# Patient Record
Sex: Female | Born: 1985 | Race: Black or African American | Hispanic: No | Marital: Single | Smoking: Never smoker
Health system: Southern US, Community
[De-identification: ages and names within clinical notes are randomized; demographics above are authoritative.]

## PROBLEM LIST (undated history)

## (undated) ENCOUNTER — Inpatient Hospital Stay (HOSPITAL_COMMUNITY): Payer: Self-pay

## (undated) DIAGNOSIS — Z789 Other specified health status: Secondary | ICD-10-CM

## (undated) HISTORY — PX: NO PAST SURGERIES: SHX2092

---
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
## 2006-04-05 ENCOUNTER — Inpatient Hospital Stay (HOSPITAL_COMMUNITY): Admission: AD | Admit: 2006-04-05 | Discharge: 2006-04-08 | Payer: Self-pay | Admitting: Obstetrics

## 2007-02-25 IMAGING — US US OB COMP LESS 14 WK
1 series · 14 of 28 positions shown · non-contrast
Comparison: none

CLINICAL DATA: 19-year-old with vaginal bleeding.  By LMP the patient is 6 weeks 3 days.
OBSTETRICAL ULTRASOUND <14 WKS AND TRANSVAGINAL OB US:
TECHNIQUE: Both transabdominal and transvaginal ultrasound examinations were performed for complete evaluation of the gestation as well as the maternal uterus, adnexal regions, and pelvic cul-de-sac.

[Series 1: us ob comp less 14 wk · 0.31mm/px · 14 of 40 slices shown]
[im 2/40]
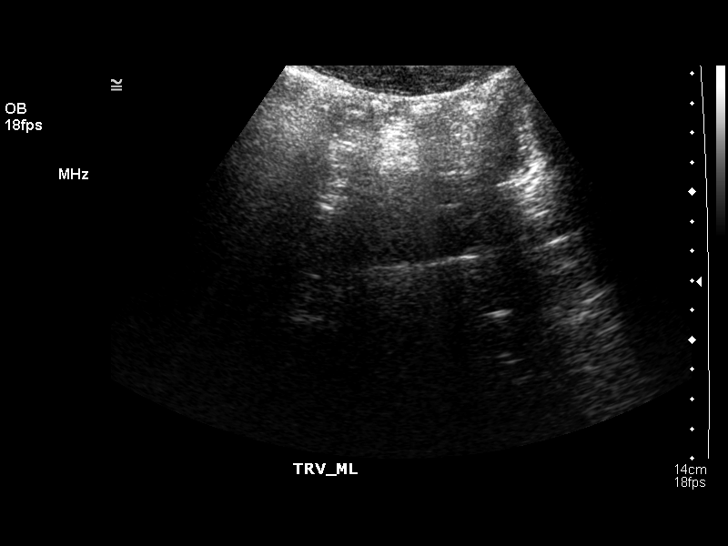
[im 5/40]
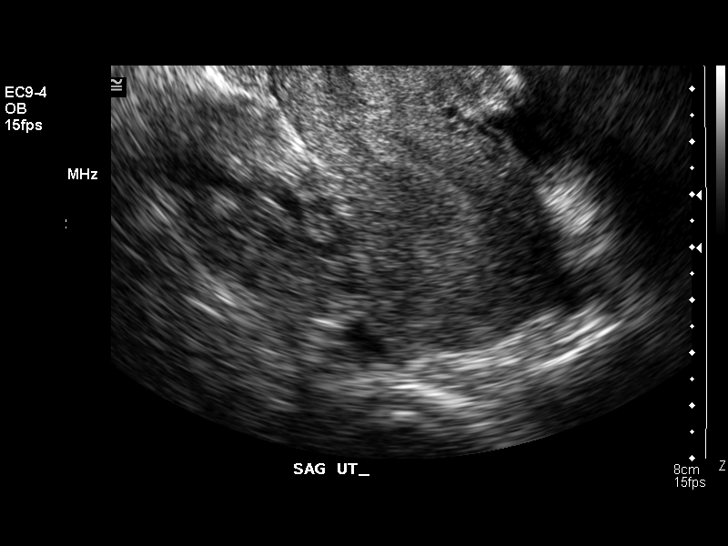
[im 8/40]
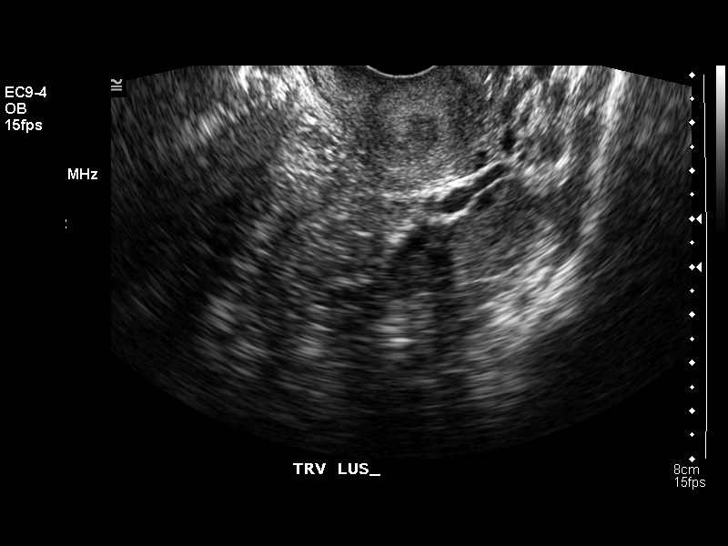
[im 11/40]
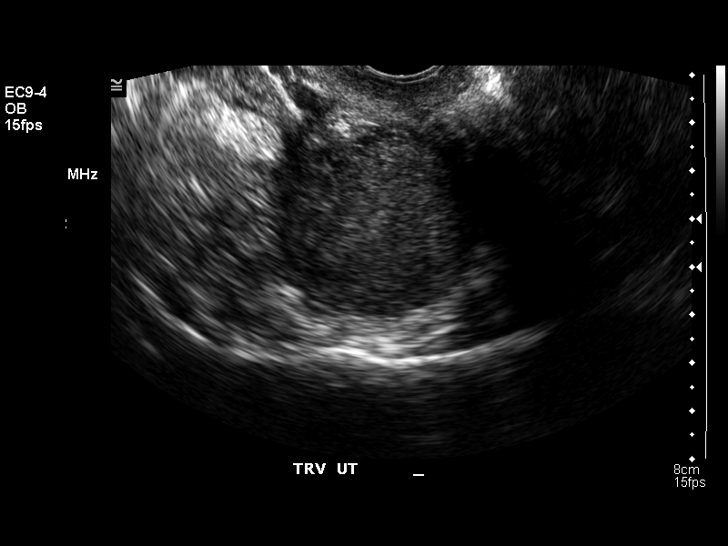
[im 14/40]
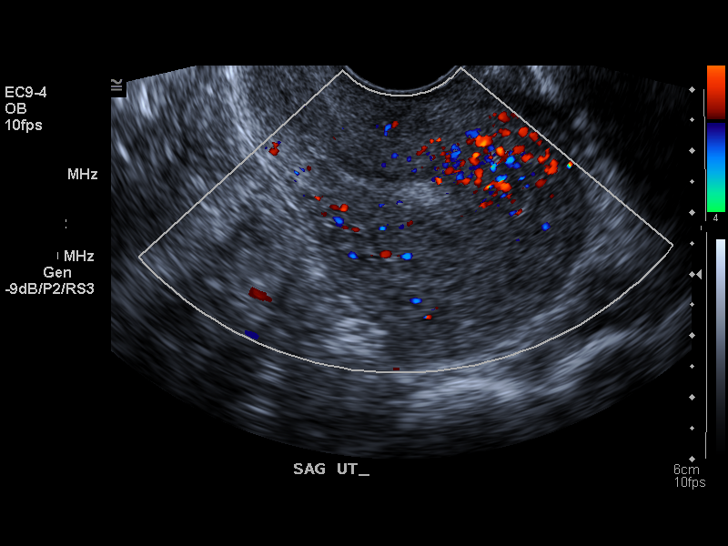
[im 16/40]
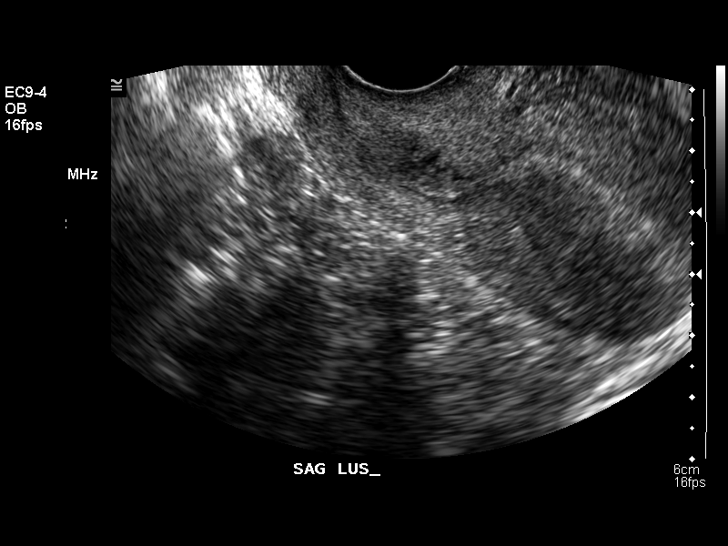
[im 19/40]
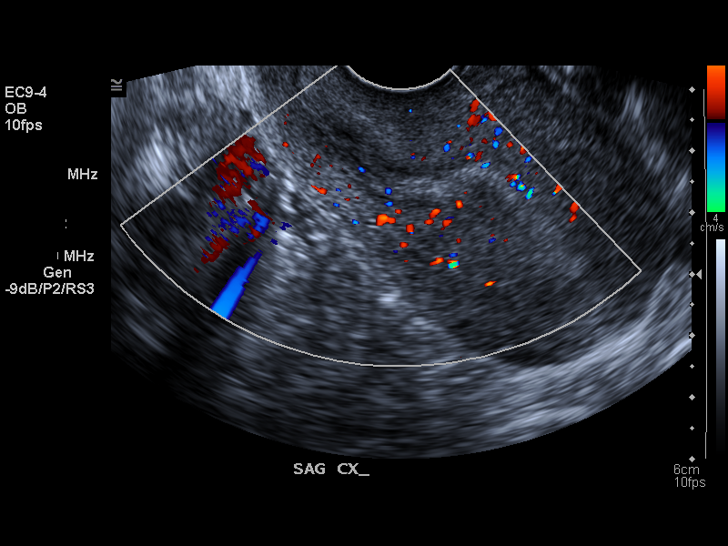
[im 22/40]
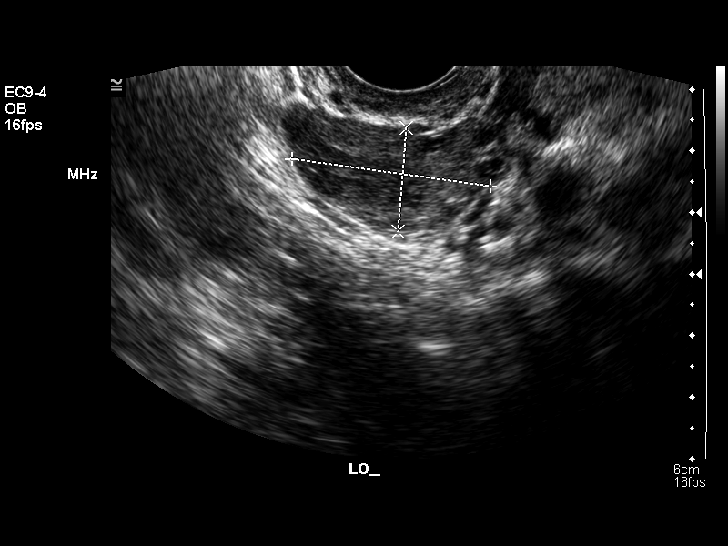
[im 25/40]
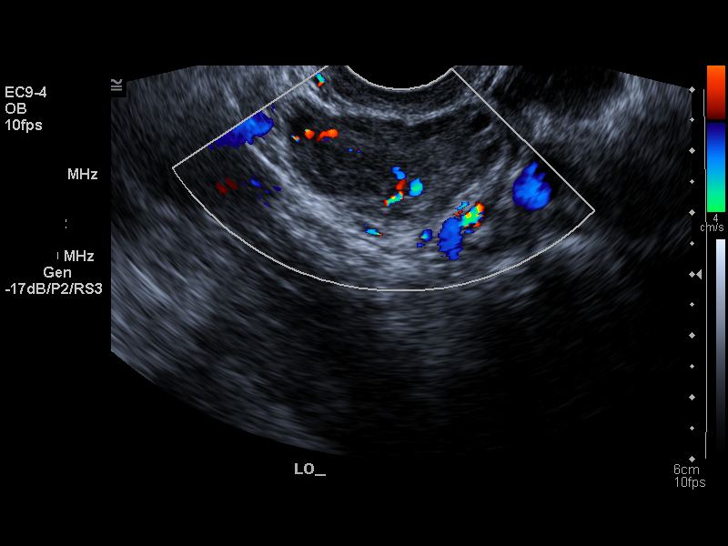
[im 28/40]
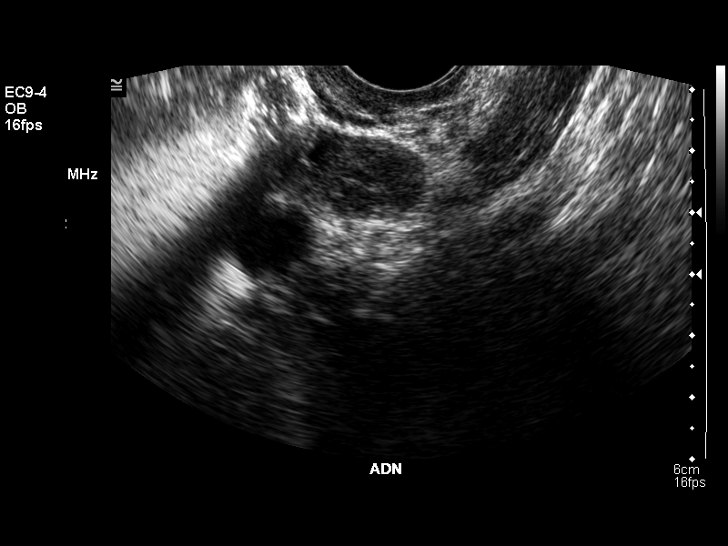
[im 31/40]
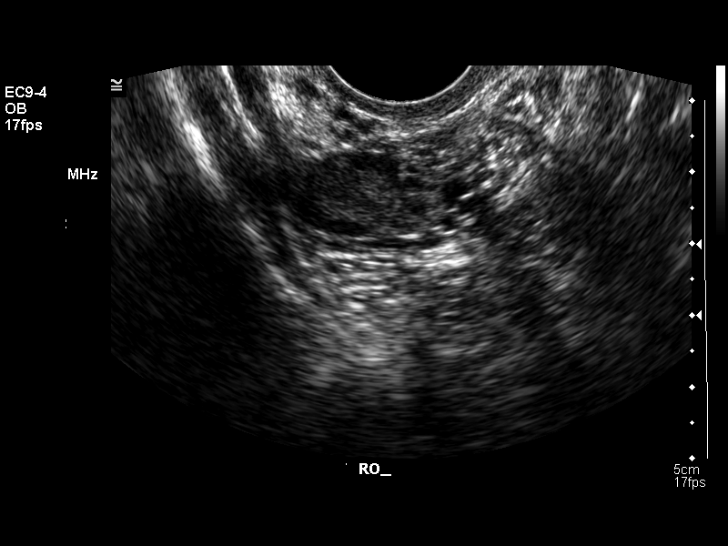
[im 34/40]
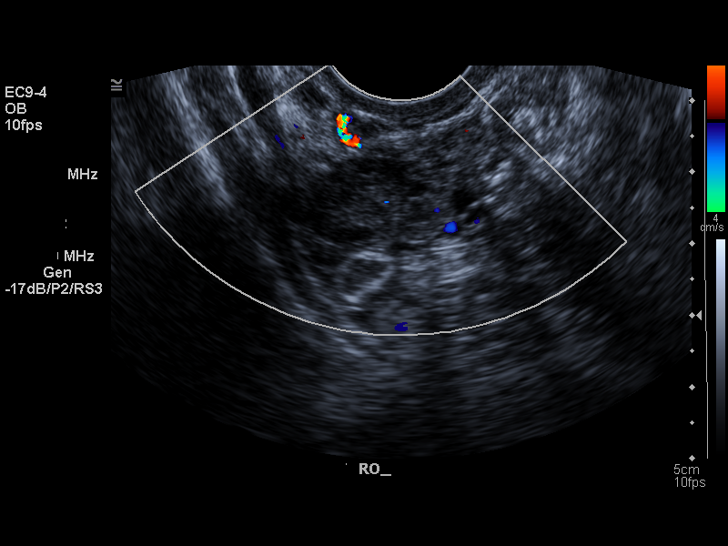
[im 37/40]
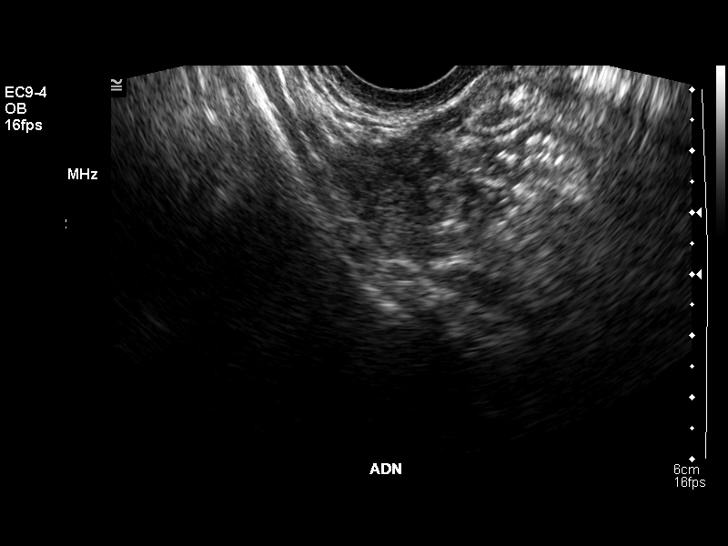
[im 40/40]
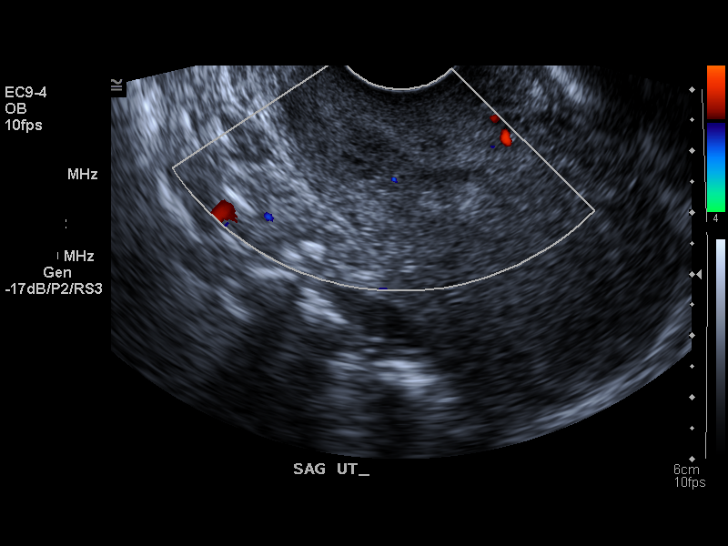

[14 of 28 positions shown; findings below may reference images not displayed]

FINDINGS: Transabdominal and endovaginal images are performed of the pelvis. 
The uterus contains no gestational sac or embryo.  The endometrial stripe is 7 mm and homogeneous.  A small amount of fluid is seen within the lower uterine canal.  The ovaries have a normal appearance.  A small amount of free pelvic fluid is likely physiologic.  No adnexal mass identified.
IMPRESSION: No evidence for intrauterine or adnexal pregnancy.  It is not known whether the findings represent a completed spontaneous abortion or an early intrauterine or ectopic pregnancy.  Close follow-up is suggested.  Serial quantitative beta hCGs and follow-up ultrasound may be helpful.

## 2007-07-18 ENCOUNTER — Inpatient Hospital Stay (HOSPITAL_COMMUNITY): Admission: AD | Admit: 2007-07-18 | Discharge: 2007-07-18 | Payer: Self-pay | Admitting: Obstetrics

## 2007-07-20 ENCOUNTER — Inpatient Hospital Stay (HOSPITAL_COMMUNITY): Admission: AD | Admit: 2007-07-20 | Discharge: 2007-07-20 | Payer: Self-pay | Admitting: Obstetrics

## 2007-08-29 ENCOUNTER — Emergency Department (HOSPITAL_COMMUNITY): Admission: EM | Admit: 2007-08-29 | Discharge: 2007-08-29 | Payer: Self-pay | Admitting: Family Medicine

## 2008-04-25 ENCOUNTER — Emergency Department (HOSPITAL_COMMUNITY): Admission: EM | Admit: 2008-04-25 | Discharge: 2008-04-25 | Payer: Self-pay | Admitting: Emergency Medicine

## 2008-10-01 ENCOUNTER — Ambulatory Visit (HOSPITAL_COMMUNITY): Admission: RE | Admit: 2008-10-01 | Discharge: 2008-10-01 | Payer: Self-pay | Admitting: Obstetrics

## 2009-01-21 ENCOUNTER — Emergency Department (HOSPITAL_COMMUNITY): Admission: EM | Admit: 2009-01-21 | Discharge: 2009-01-21 | Payer: Self-pay | Admitting: Emergency Medicine

## 2009-10-01 ENCOUNTER — Ambulatory Visit (HOSPITAL_COMMUNITY): Admission: RE | Admit: 2009-10-01 | Discharge: 2009-10-01 | Payer: Self-pay | Admitting: Obstetrics

## 2010-01-19 ENCOUNTER — Ambulatory Visit (HOSPITAL_COMMUNITY)
Admission: RE | Admit: 2010-01-19 | Discharge: 2010-01-19 | Payer: Self-pay | Source: Home / Self Care | Admitting: Obstetrics

## 2010-01-26 ENCOUNTER — Inpatient Hospital Stay (HOSPITAL_COMMUNITY)
Admission: AD | Admit: 2010-01-26 | Discharge: 2010-01-26 | Payer: Self-pay | Source: Home / Self Care | Admitting: Obstetrics

## 2010-01-26 ENCOUNTER — Ambulatory Visit: Payer: Self-pay | Admitting: Nurse Practitioner

## 2010-06-10 LAB — WET PREP, GENITAL

## 2010-07-02 LAB — POCT URINALYSIS DIP (DEVICE)
Ketones, ur: NEGATIVE mg/dL
Nitrite: NEGATIVE
Specific Gravity, Urine: 1.025 (ref 1.005–1.030)

## 2010-08-13 ENCOUNTER — Inpatient Hospital Stay (HOSPITAL_COMMUNITY)
Admission: AD | Admit: 2010-08-13 | Discharge: 2010-08-16 | DRG: 775 | Disposition: A | Discharge status: Home / Self Care | Payer: Medicaid Other | Source: Ambulatory Visit | Attending: Obstetrics | Admitting: Obstetrics

## 2010-08-13 ENCOUNTER — Encounter (HOSPITAL_COMMUNITY): Payer: Self-pay | Admitting: *Deleted

## 2010-08-13 LAB — CBC
Hemoglobin: 9.7 g/dL — ABNORMAL LOW (ref 12.0–15.0)
MCHC: 32.6 g/dL (ref 30.0–36.0)
RBC: 3.25 MIL/uL — ABNORMAL LOW (ref 3.87–5.11)

## 2010-08-14 LAB — RPR: RPR Ser Ql: NONREACTIVE

## 2010-08-15 LAB — CBC
MCH: 29.2 pg (ref 26.0–34.0)
MCHC: 31.6 g/dL (ref 30.0–36.0)
Platelets: 336 10*3/uL (ref 150–400)
RDW: 15.5 % (ref 11.5–15.5)
WBC: 8.5 10*3/uL (ref 4.0–10.5)

## 2010-12-22 LAB — ABO/RH: ABO/RH(D): A POS

## 2010-12-22 LAB — CBC
HCT: 35.3 — ABNORMAL LOW
MCHC: 34.8
RDW: 15.1
WBC: 6.8

## 2010-12-22 LAB — HCG, QUANTITATIVE, PREGNANCY: hCG, Beta Chain, Quant, S: 349 — ABNORMAL HIGH

## 2010-12-22 LAB — WET PREP, GENITAL: Trich, Wet Prep: NONE SEEN

## 2010-12-24 LAB — POCT URINALYSIS DIP (DEVICE)
Ketones, ur: NEGATIVE
Protein, ur: NEGATIVE
Specific Gravity, Urine: 1.025
pH: 6.5

## 2010-12-24 LAB — WET PREP, GENITAL
Clue Cells Wet Prep HPF POC: NONE SEEN
Trich, Wet Prep: NONE SEEN

## 2010-12-24 LAB — POCT PREGNANCY, URINE: Preg Test, Ur: NEGATIVE

## 2011-08-08 IMAGING — US US OB COMP LESS 14 WK
1 series · 14 of 28 positions shown · non-contrast
Comparison: none

OBSTETRICAL ULTRASOUND:
 This ultrasound exam was performed in the [HOSPITAL] Ultrasound Department.  The OB US report was generated in the AS system, and faxed to the ordering physician.  This report is also available in [HOSPITAL]?s Access[employer]ware and in [REDACTED] PACS.

[Series 1: us ob comp less 14 wks · 0.19mm/px · 14 of 33 slices shown]
[im 2/33]
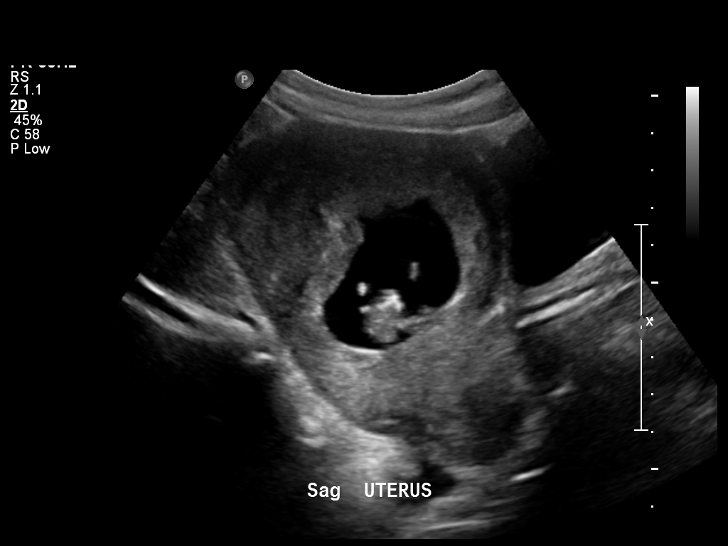
[im 4/33]
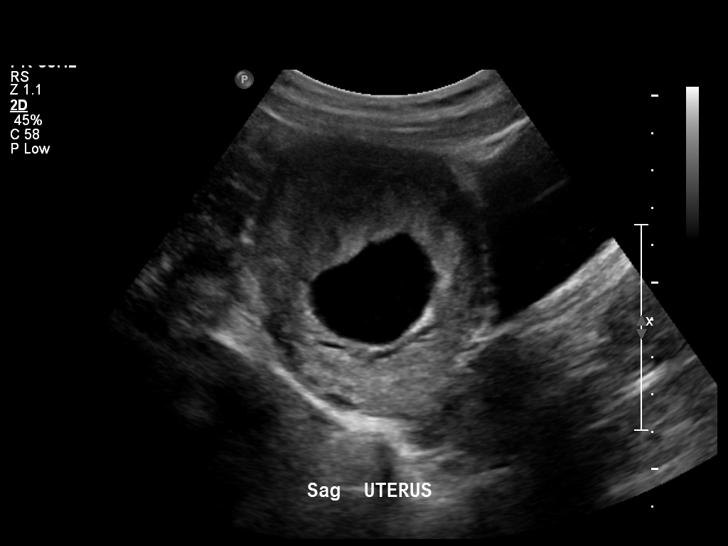
[im 6/33]
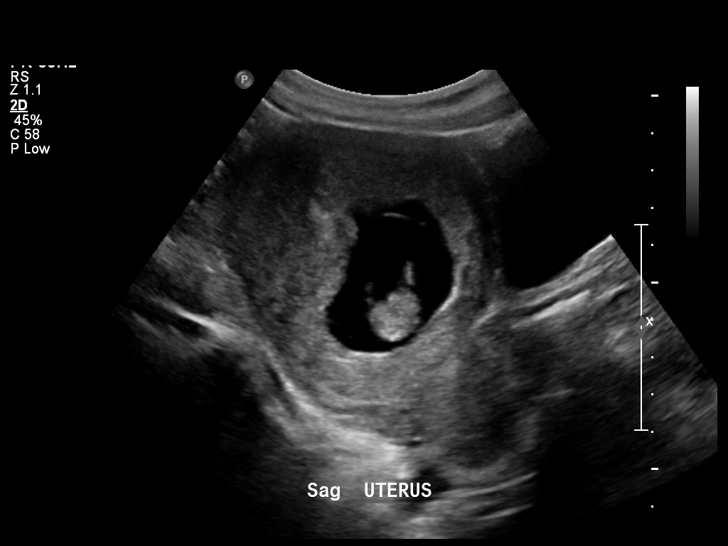
[im 9/33]
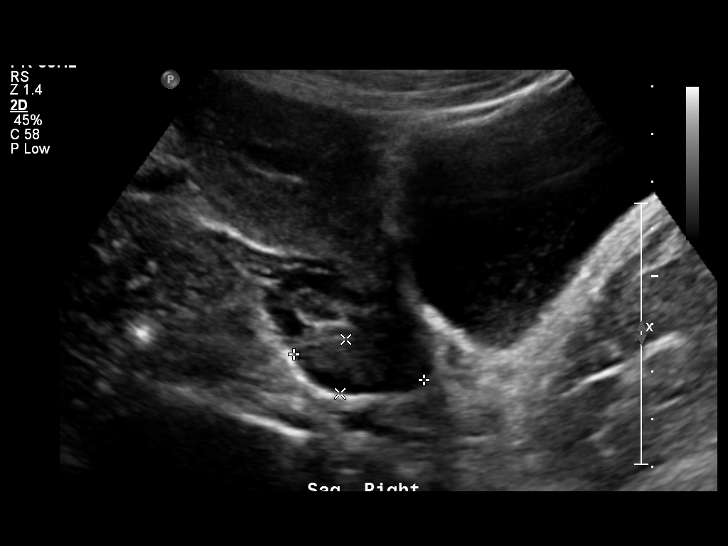
[im 11/33]
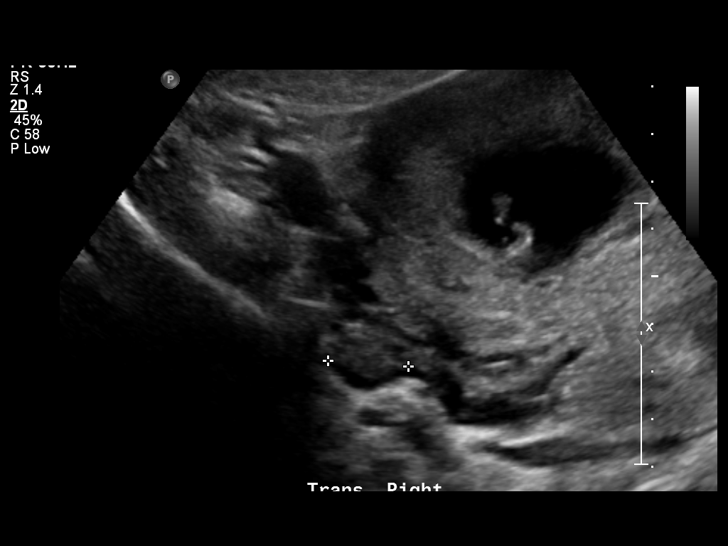
[im 14/33]
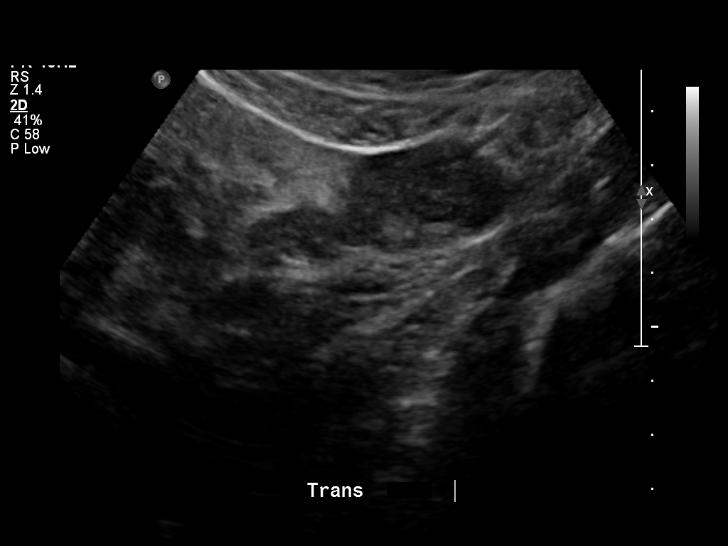
[im 16/33]
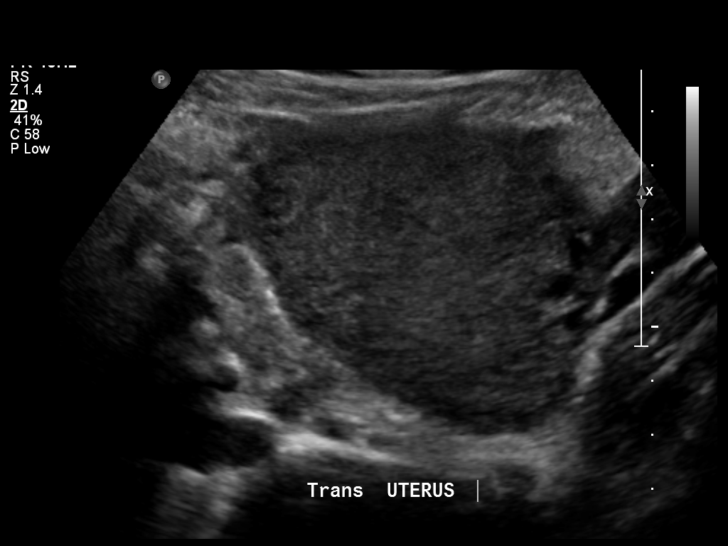
[im 18/33]
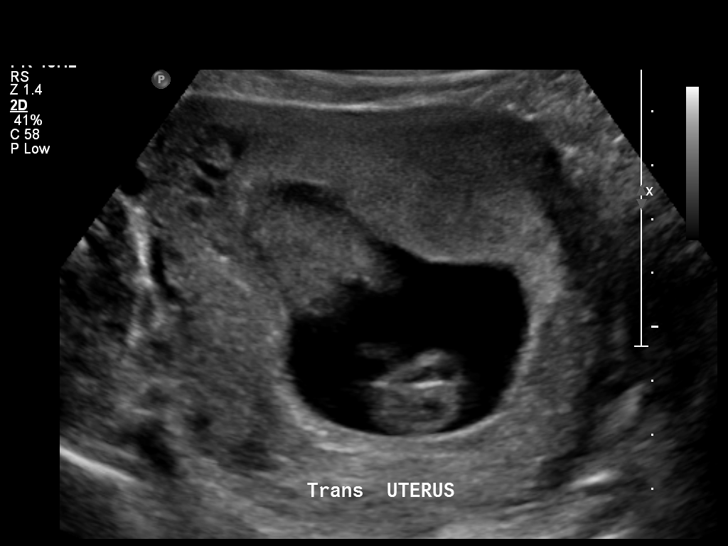
[im 21/33]
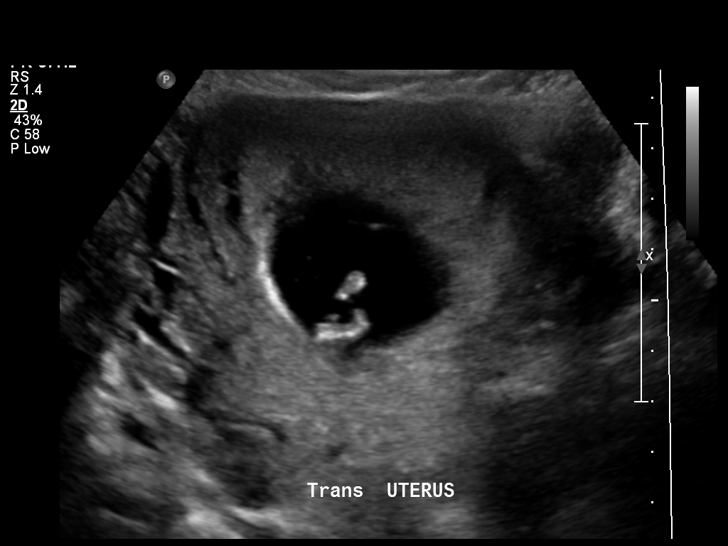
[im 23/33]
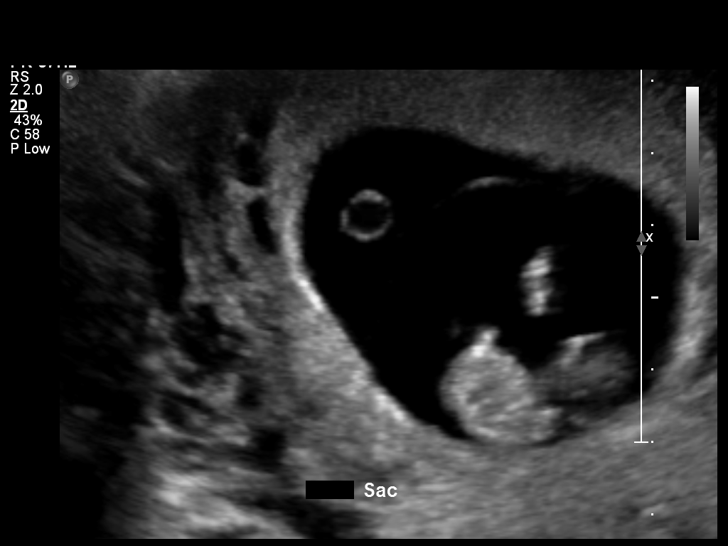
[im 25/33]
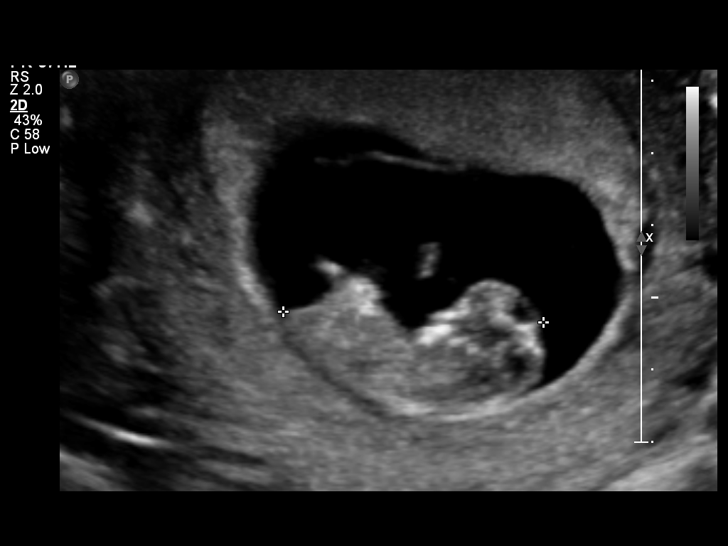
[im 28/33]
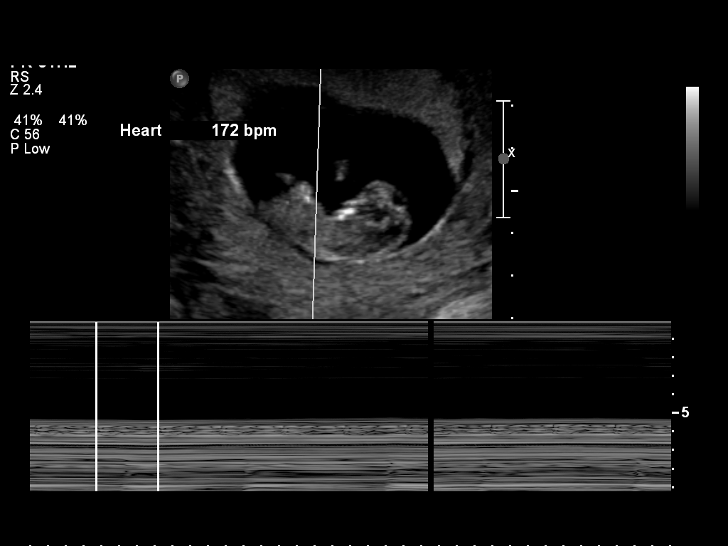
[im 30/33]
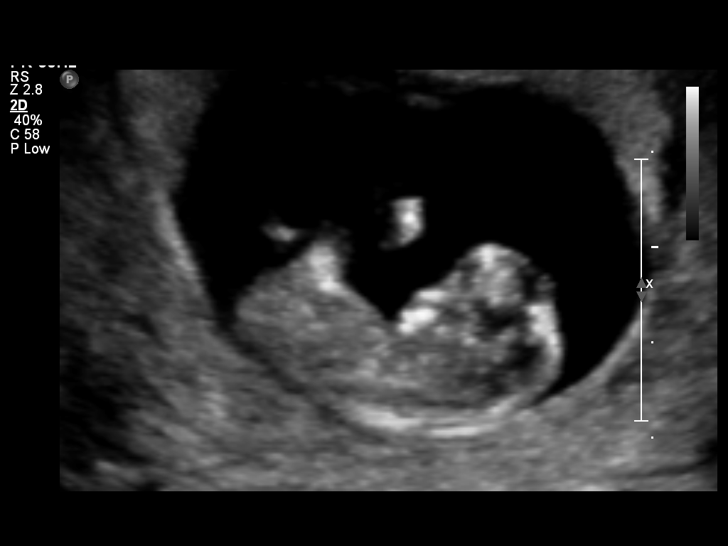
[im 33/33]
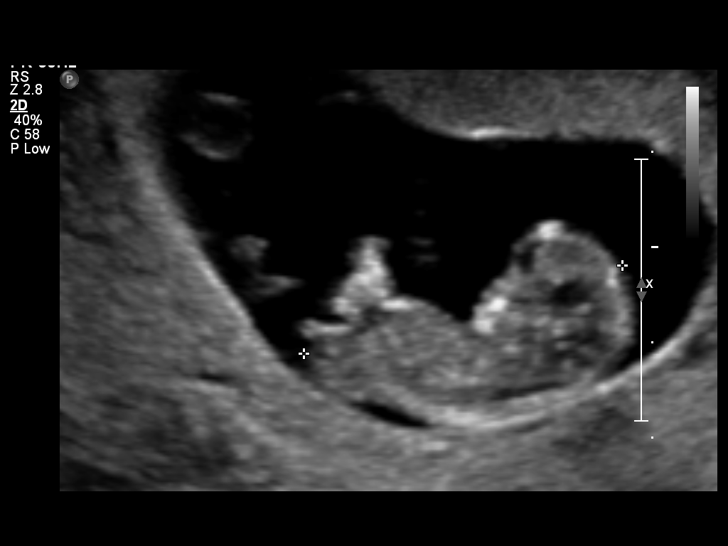

[14 of 28 positions shown; findings below may reference images not displayed]

IMPRESSION: See AS Obstetric US report.

## 2011-10-19 ENCOUNTER — Other Ambulatory Visit: Payer: Self-pay | Admitting: Obstetrics

## 2011-11-26 IMAGING — US US OB COMP LESS 14 WK
1 series · 14 of 28 positions shown · non-contrast
Comparison: none

OBSTETRICAL ULTRASOUND:
 This ultrasound exam was performed in the [HOSPITAL] Ultrasound Department.  The OB US report was generated in the AS system, and faxed to the ordering physician.  This report is also available in [HOSPITAL]?s [employer] and in [REDACTED] PACS.

[Series 1: us ob comp less 14 wks · 29 acquisitions, 14 frames shown]
[im 2/29]
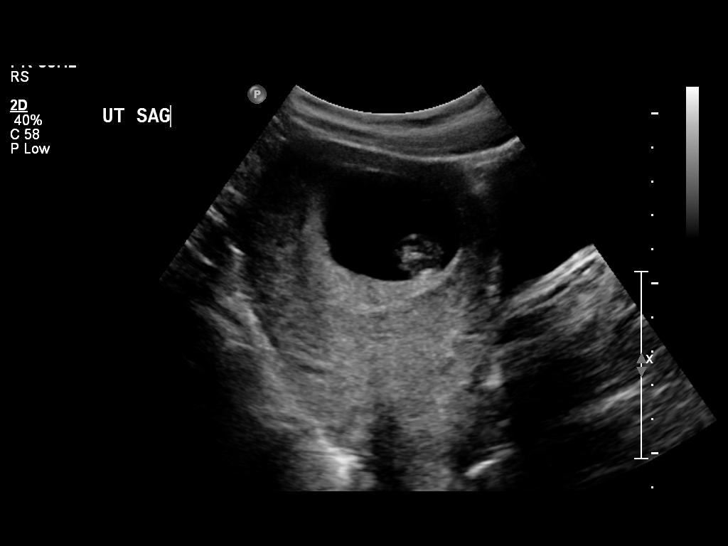
[im 4/29]
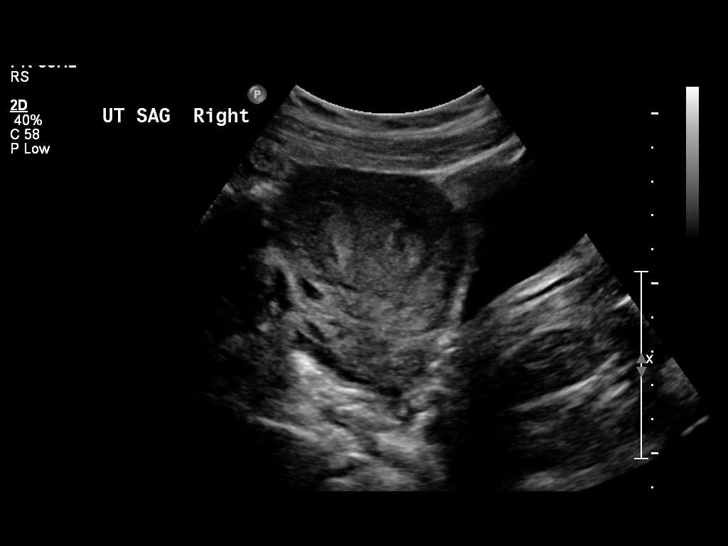
[im 6/29]
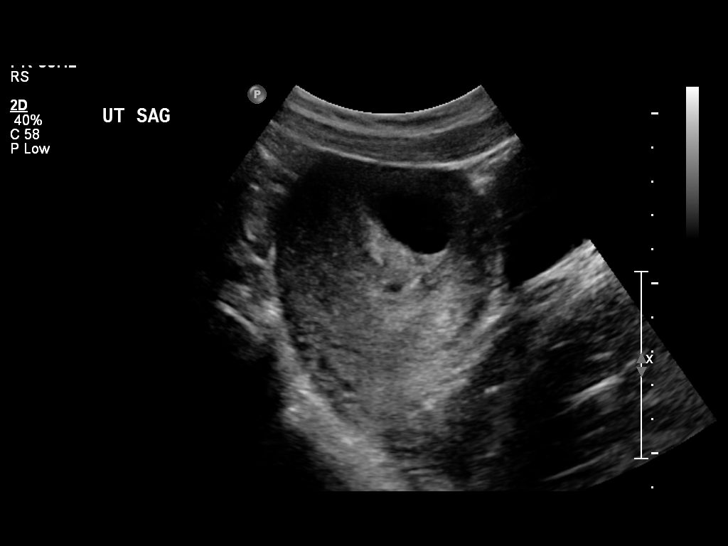
[im 8/29]
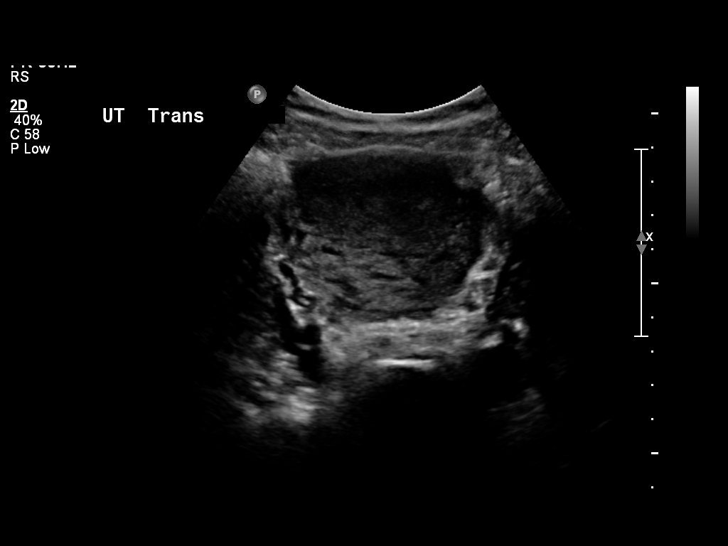
[im 10/29]
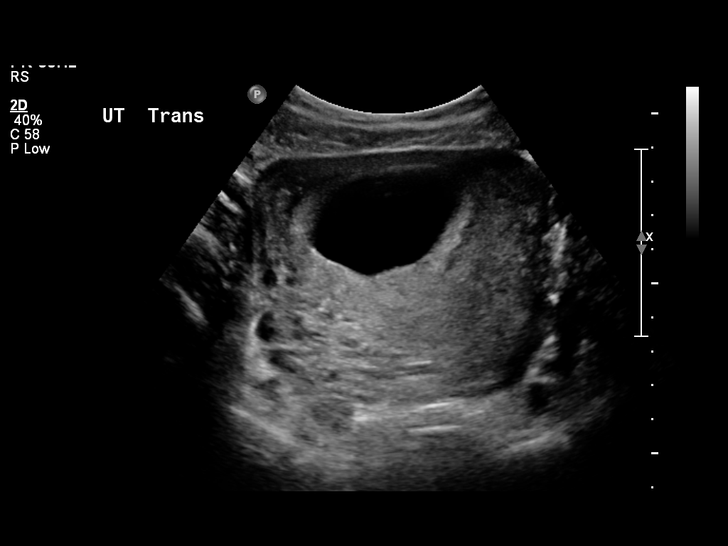
[im 12/29]
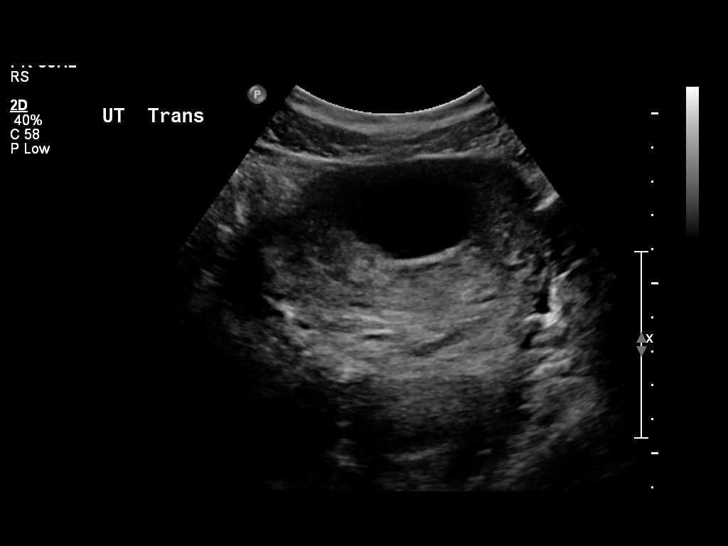
[im 14/29]
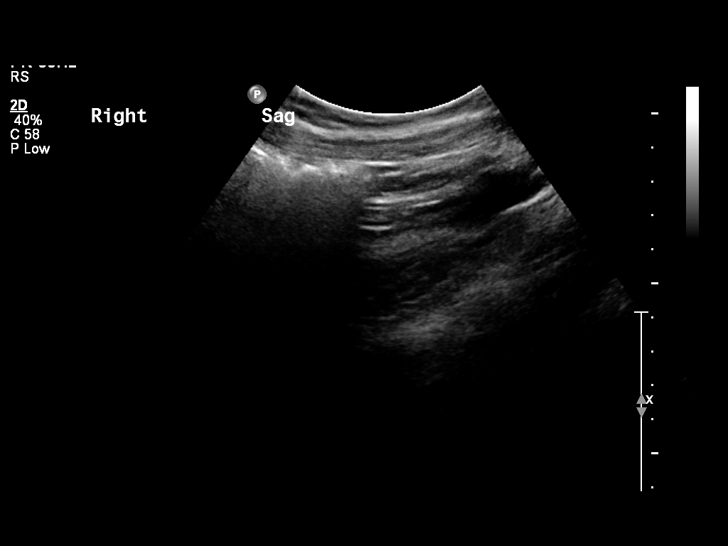
[im 16/29]
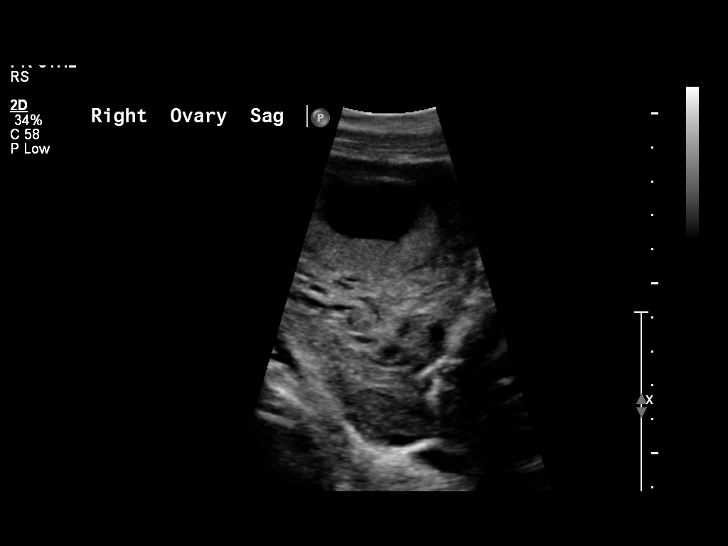
[im 18/29]
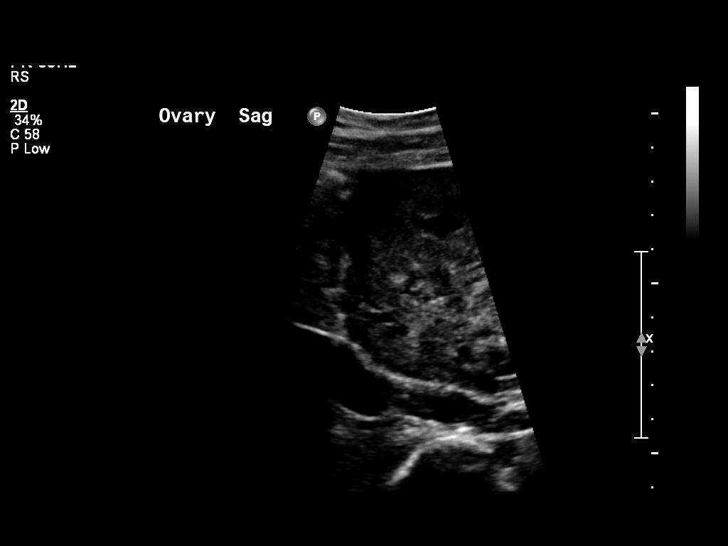
[im 20/29]
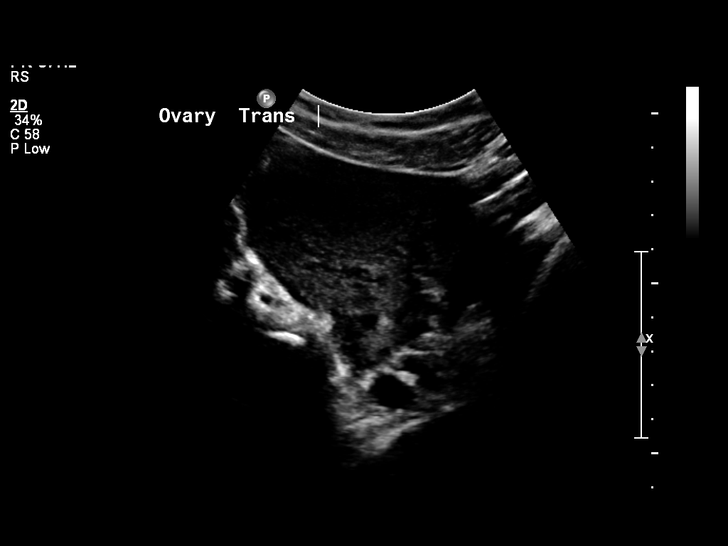
[im 22/29]
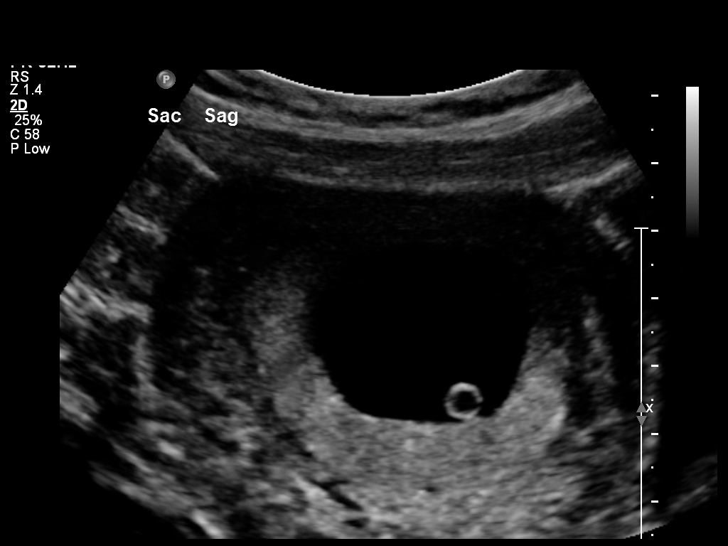
[im 24/29]
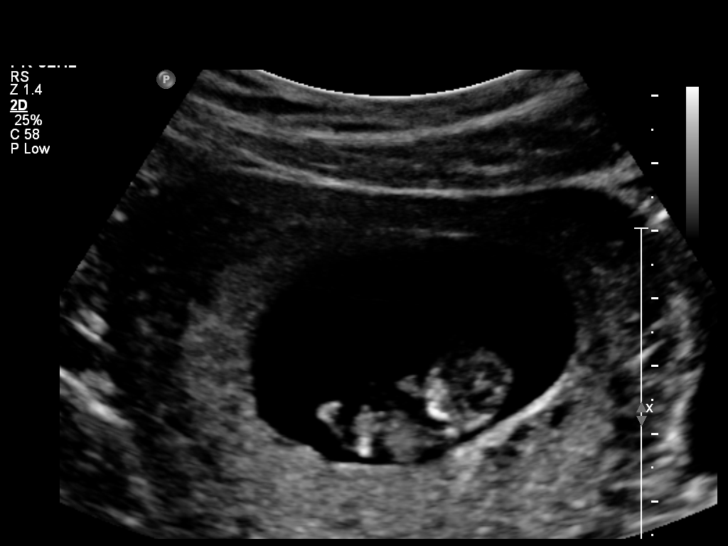
[im 26/29]
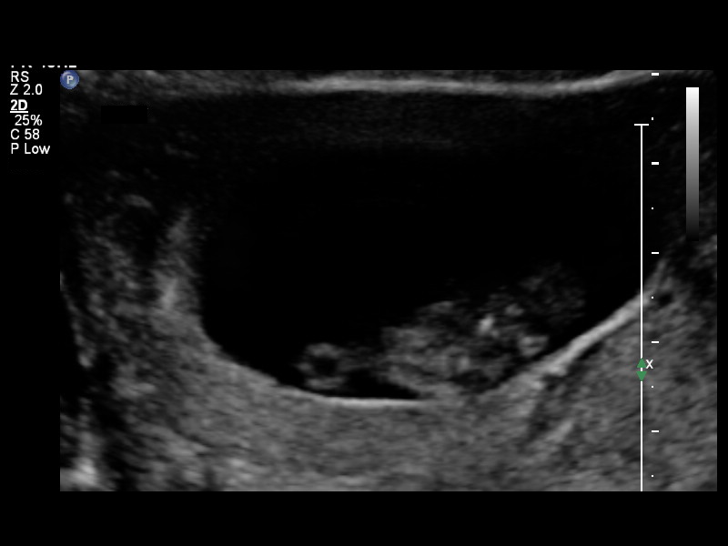
[im 29/29]
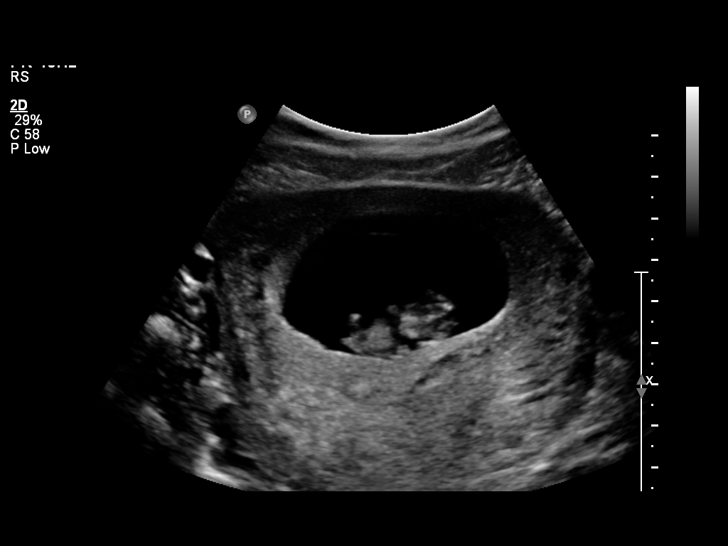

[14 of 28 positions shown; findings below may reference images not displayed]

IMPRESSION: See AS Obstetric US report.

## 2012-01-20 ENCOUNTER — Other Ambulatory Visit (HOSPITAL_COMMUNITY): Payer: Self-pay | Admitting: Obstetrics

## 2012-01-20 DIAGNOSIS — O3680X0 Pregnancy with inconclusive fetal viability, not applicable or unspecified: Secondary | ICD-10-CM

## 2012-01-20 DIAGNOSIS — Z3490 Encounter for supervision of normal pregnancy, unspecified, unspecified trimester: Secondary | ICD-10-CM

## 2012-01-24 ENCOUNTER — Ambulatory Visit (HOSPITAL_COMMUNITY)
Admission: RE | Admit: 2012-01-24 | Discharge: 2012-01-24 | Disposition: A | Discharge status: Home / Self Care | Payer: Medicaid Other | Source: Ambulatory Visit | Attending: Obstetrics | Admitting: Obstetrics

## 2012-01-24 DIAGNOSIS — O3680X0 Pregnancy with inconclusive fetal viability, not applicable or unspecified: Secondary | ICD-10-CM

## 2012-01-24 DIAGNOSIS — Z3490 Encounter for supervision of normal pregnancy, unspecified, unspecified trimester: Secondary | ICD-10-CM

## 2012-01-24 DIAGNOSIS — Z3689 Encounter for other specified antenatal screening: Secondary | ICD-10-CM | POA: Insufficient documentation

## 2012-12-14 ENCOUNTER — Inpatient Hospital Stay (HOSPITAL_COMMUNITY): Payer: Medicaid Other

## 2012-12-14 ENCOUNTER — Inpatient Hospital Stay (HOSPITAL_COMMUNITY)
Admission: AD | Admit: 2012-12-14 | Discharge: 2012-12-14 | Disposition: A | Discharge status: Home / Self Care | Payer: Medicaid Other | Source: Ambulatory Visit | Attending: Obstetrics | Admitting: Obstetrics

## 2012-12-14 ENCOUNTER — Encounter (HOSPITAL_COMMUNITY): Payer: Self-pay | Admitting: *Deleted

## 2012-12-14 DIAGNOSIS — O034 Incomplete spontaneous abortion without complication: Secondary | ICD-10-CM

## 2012-12-14 DIAGNOSIS — O039 Complete or unspecified spontaneous abortion without complication: Secondary | ICD-10-CM

## 2012-12-14 DIAGNOSIS — O071 Delayed or excessive hemorrhage following failed attempted termination of pregnancy: Secondary | ICD-10-CM | POA: Insufficient documentation

## 2012-12-14 LAB — CBC
Platelets: 363 10*3/uL (ref 150–400)
RBC: 3.4 MIL/uL — ABNORMAL LOW (ref 3.87–5.11)
WBC: 7.3 10*3/uL (ref 4.0–10.5)

## 2012-12-14 LAB — HCG, QUANTITATIVE, PREGNANCY: hCG, Beta Chain, Quant, S: 850 m[IU]/mL — ABNORMAL HIGH (ref <5)

## 2012-12-14 MED ORDER — HYDROMORPHONE HCL PF 1 MG/ML IJ SOLN
1.0000 mg | Freq: Once | INTRAMUSCULAR | Status: AC
  Administered 2012-12-14: 1 mg via INTRAVENOUS
  Filled 2012-12-14: qty 1

## 2012-12-14 MED ORDER — METHYLERGONOVINE MALEATE 0.2 MG PO TABS: 0.2000 mg | ORAL_TABLET | Freq: Four times a day (QID) | ORAL | Status: DC

## 2012-12-14 MED ORDER — LACTATED RINGERS IV BOLUS (SEPSIS)
1000.0000 mL | Freq: Once | INTRAVENOUS | Status: AC
  Administered 2012-12-14: 1000 mL via INTRAVENOUS

## 2012-12-14 NOTE — MAU Note (Addendum)
Terminated a preg last Friday ([place]). Was doing ok, has been bleeding- started getting heavier 4 days later, started passing clots- bigger than a golf ball and  [place] blood this morning.Marland Kitchen

## 2012-12-14 NOTE — MAU Provider Note (Signed)
History     CSN: [id number]  Arrival date and time: 12/14/12 0906   None     No chief complaint on file.  HPI  Pt is 27 yo black G4P0020 s/p TAB @ 14 weeks on 12/08/2012.  Pt started having heavy bleeding with clots this morning about 6 am. Pt has saturated about 3 pads in 4 hours. Pt denies cramping or fever.  Pt states there were no complications with her procedure.  Pt last saw Dr. Gaynell Face in Dec 2013. Pt says she was not checked for infections. Pt last ate last night about 8 pm.  Pt states she is not having pain at this time and does not want anything for pain.   History reviewed. No pertinent past medical history.  History reviewed. No pertinent past surgical history.  History reviewed. No pertinent family history.  History  Substance Use Topics  . Smoking status: Never Smoker   . Smokeless tobacco: Never Used  . Alcohol Use: No    Allergies: No Known Allergies  No prescriptions prior to admission    Review of Systems  Gastrointestinal: Negative for nausea, vomiting and abdominal pain.  Genitourinary: Negative for dysuria and urgency.   Physical Exam   Blood pressure 122/71, pulse 95, temperature 99 F (37.2 C), temperature source Oral, resp. rate 18, height 5\' 4"  (1.626 m), weight 77.565 kg (171 lb), last menstrual period 08/24/2012, unknown if currently breastfeeding.  Physical Exam  Nursing note and vitals reviewed. Constitutional: She is oriented to person, place, and time. She appears well-developed and well-nourished. No distress.  HENT:  Head: Normocephalic.  Eyes: Pupils are equal, round, and reactive to light.  Neck: Normal range of motion. Neck supple.  Cardiovascular: Normal rate.   Respiratory: Effort normal.  GI: Soft. She exhibits no distension. There is no tenderness.  Genitourinary:  Large amount of bright red blood in os with large clots; cervix dilated with tissue at os- unable to get all of tissue- bleeding controlled after clots removed.   Musculoskeletal: Normal range of motion.  Neurological: She is alert and oriented to person, place, and time.  Skin: Skin is warm and dry.  Psychiatric: She has a normal mood and affect.    MAU Course  Procedures Results for orders placed during the hospital encounter of 12/14/12 (from the past 24 hour(s))  CBC     Status: Abnormal   Collection Time    12/14/12  9:30 AM      Result Value Range   WBC 7.3  4.0 - 10.5 K/uL   RBC 3.40 (*) 3.87 - 5.11 MIL/uL   Hemoglobin 10.4 (*) 12.0 - 15.0 g/dL   HCT 95.6 (*) 21.3 - 08.6 %   MCV 92.6  78.0 - 100.0 fL   MCH 30.6  26.0 - 34.0 pg   MCHC 33.0  30.0 - 36.0 g/dL   RDW 57.8  46.9 - 62.9 %   Platelets 363  150 - 400 K/uL  HCG, QUANTITATIVE, PREGNANCY     Status: Abnormal   Collection Time    12/14/12  9:30 AM      Result Value Range   hCG, Beta Chain, Quant, S 850 (*) <5 mIU/mL   US Transvaginal Non-ob  12/14/2012   CLINICAL DATA:  Heavy bleeding. Post abortion 12/08/2012.  EXAM: TRANSABDOMINAL AND TRANSVAGINAL ULTRASOUND OF PELVIS  TECHNIQUE: Both transabdominal and transvaginal ultrasound examinations of the pelvis were performed. Transabdominal technique was performed for global imaging of the pelvis including uterus, ovaries,  adnexal regions, and pelvic cul-de-sac. It was necessary to proceed with endovaginal exam following the transabdominal exam to visualize the uterus, endometrium and ovaries.  COMPARISON:  None  FINDINGS: Uterus  Measurements: 10.6 x 6.0 x 6.8 cm. No fibroids or other mass visualized.  Endometrium  Thickness: Thickened, 19 mm. Heterogeneous material within the endometrium with internal blood flow measures 3.5 x 2.8 x 2.1 cm compatible with retained products of conception.  Right ovary  Measurements: 3.3 x 1.6 x 1.8 cm. Normal appearance/no adnexal mass.  Left ovary  Measurements: 4.1 x 1.7 x 2.2 cm. Normal appearance/no adnexal mass.  Other findings  Trace free fluid in the pelvis.  IMPRESSION: Heterogeneous material  within the thickened endometrium which contains internal blood flow compatible with retained products of conception.   Electronically Signed   By: Charlett Nose M.D.   On: 12/14/2012 11:59   US Pelvis Complete  12/14/2012   CLINICAL DATA:  Heavy bleeding. Post abortion 12/08/2012.  EXAM: TRANSABDOMINAL AND TRANSVAGINAL ULTRASOUND OF PELVIS  TECHNIQUE: Both transabdominal and transvaginal ultrasound examinations of the pelvis were performed. Transabdominal technique was performed for global imaging of the pelvis including uterus, ovaries, adnexal regions, and pelvic cul-de-sac. It was necessary to proceed with endovaginal exam following the transabdominal exam to visualize the uterus, endometrium and ovaries.  COMPARISON:  None  FINDINGS: Uterus  Measurements: 10.6 x 6.0 x 6.8 cm. No fibroids or other mass visualized.  Endometrium  Thickness: Thickened, 19 mm. Heterogeneous material within the endometrium with internal blood flow measures 3.5 x 2.8 x 2.1 cm compatible with retained products of conception.  Right ovary  Measurements: 3.3 x 1.6 x 1.8 cm. Normal appearance/no adnexal mass.  Left ovary  Measurements: 4.1 x 1.7 x 2.2 cm. Normal appearance/no adnexal mass.  Other findings  Trace free fluid in the pelvis.  IMPRESSION: Heterogeneous material within the thickened endometrium which contains internal blood flow compatible with retained products of conception.   Electronically Signed   By: Charlett Nose M.D.   On: 12/14/2012 11:59  pt had episode of syncope  IVF LR started- rapid response team called Dr. Gaynell Face called- will come to see pt 12:15 pmDr. Gaynell Face here- curretaged uterus for remaining material - bleeding scant Dilaudid 1 mg IV given to pain with relief Pt given 2 liters LR and pt eat- sitting upright and ready to go home Assessment and Plan  Retained products of conception-  Methergine 0.2mg  #6 Ibuprofen 800mg  every 8 hrs as needed for pain F/u with Clinic in  [facility] 12/14/2012, 10:03 AM

## 2012-12-14 NOTE — MAU Note (Addendum)
27 yo, G4P2, presenting to MAU s/p TAB on Monday; reports passing clots and bleeding since 0600 today. Denies pain at this time.  Reports changing 3 pads since 0600 today.

## 2012-12-14 NOTE — Progress Notes (Signed)
Pt called out stated she felt hot and that she felt like she was going to pass out. Went into room pt grabbed on to me and stated she needed help she felt like she was going to pass out. lowered pt head and she loss conciseness . Eyes rolled back unresponsive to verbal commands.  Episode lasted about 30 sec and pt v/SS stable. Pt regained conciseness NP and primary nurse at bedside.

## 2013-04-05 ENCOUNTER — Other Ambulatory Visit (HOSPITAL_COMMUNITY)
Admission: RE | Admit: 2013-04-05 | Discharge: 2013-04-05 | Disposition: A | Discharge status: Home / Self Care | Payer: Medicaid Other | Source: Ambulatory Visit | Attending: Family Medicine | Admitting: Family Medicine

## 2013-04-05 ENCOUNTER — Emergency Department (HOSPITAL_COMMUNITY)
Admission: EM | Admit: 2013-04-05 | Discharge: 2013-04-05 | Disposition: A | Discharge status: Home / Self Care | Payer: Medicaid Other | Source: Home / Self Care | Attending: Family Medicine | Admitting: Family Medicine

## 2013-04-05 ENCOUNTER — Encounter (HOSPITAL_COMMUNITY): Payer: Self-pay | Admitting: Emergency Medicine

## 2013-04-05 DIAGNOSIS — N760 Acute vaginitis: Secondary | ICD-10-CM | POA: Insufficient documentation

## 2013-04-05 DIAGNOSIS — N390 Urinary tract infection, site not specified: Secondary | ICD-10-CM

## 2013-04-05 DIAGNOSIS — Z113 Encounter for screening for infections with a predominantly sexual mode of transmission: Secondary | ICD-10-CM | POA: Insufficient documentation

## 2013-04-05 LAB — POCT URINALYSIS DIP (DEVICE)
Bilirubin Urine: NEGATIVE
Glucose, UA: NEGATIVE mg/dL
KETONES UR: NEGATIVE mg/dL
Nitrite: POSITIVE — AB
PROTEIN: NEGATIVE mg/dL
SPECIFIC GRAVITY, URINE: 1.02 (ref 1.005–1.030)
UROBILINOGEN UA: 1 mg/dL (ref 0.0–1.0)
pH: 7.5 (ref 5.0–8.0)

## 2013-04-05 LAB — POCT PREGNANCY, URINE: PREG TEST UR: NEGATIVE

## 2013-04-05 MED ORDER — CEPHALEXIN 500 MG PO CAPS: 500.0000 mg | ORAL_CAPSULE | Freq: Four times a day (QID) | ORAL | Status: DC

## 2013-04-05 NOTE — ED Provider Notes (Addendum)
CSN: [id number]     Arrival date & time 04/05/13  1933 History   First MD Initiated Contact with Patient 04/05/13 2052     Chief Complaint  Patient presents with  . Abdominal Pain   (Consider location/radiation/quality/duration/timing/severity/associated sxs/prior Treatment) Patient is a 28 y.o. female presenting with female genitourinary complaint. The history is provided by the patient.  Female GU Problem This is a new problem. The current episode started more than 1 week ago ([place] in [place] , has f/u with dr Gaynell Facemarshall on 1/12, not on birth control, having irreg menses since and is concerned,). The problem has not changed since onset.Pertinent negatives include no chest pain and no abdominal pain.    History reviewed. No pertinent past medical history. History reviewed. No pertinent past surgical history. No family history on file. History  Substance Use Topics  . Smoking status: Never Smoker   . Smokeless tobacco: Never Used  . Alcohol Use: No   OB History   Grav Para Term Preterm Abortions TAB SAB Ect Mult Living   4 2   2 2          Review of Systems  Constitutional: Negative.  Negative for fever and chills.  Cardiovascular: Negative for chest pain.  Gastrointestinal: Negative.  Negative for nausea, vomiting and abdominal pain.  Genitourinary: Positive for menstrual problem and pelvic pain. Negative for dysuria, urgency, frequency, hematuria, vaginal bleeding and vaginal discharge.    Allergies  Review of patient's allergies indicates no known allergies.  Home Medications   Current Outpatient Rx  Name  Route  Sig  Dispense  Refill  . cephALEXin (KEFLEX) 500 MG capsule   Oral   Take 1 capsule (500 mg total) by mouth 4 (four) times daily. Take all of medicine and drink lots of fluids   20 capsule   0   . ibuprofen (ADVIL,MOTRIN) 800 MG tablet   Oral   Take 800 mg by mouth every 8 (eight) hours as needed for pain.         .  methylergonovine (METHERGINE) 0.2 MG tablet   Oral   Take 1 tablet (0.2 mg total) by mouth every 6 (six) hours.   6 tablet   0    BP 124/78  Pulse 87  Temp(Src) 98.7 F (37.1 C) (Oral)  Resp 18  SpO2 100% Physical Exam  Nursing note and vitals reviewed. Constitutional: She is oriented to person, place, and time. She appears well-developed and well-nourished.  Neck: Neck supple.  Abdominal: Soft. Bowel sounds are normal. She exhibits no distension and no mass. There is no tenderness. There is no rebound and no guarding.  Genitourinary: Vagina normal and uterus normal. Cervix exhibits no motion tenderness, no discharge and no friability. Right adnexum displays no mass and no tenderness. Left adnexum displays no mass and no tenderness. No erythema or tenderness around the vagina. No vaginal discharge found.  Lymphadenopathy:    She has no cervical adenopathy.  Neurological: She is alert and oriented to person, place, and time.  Skin: Skin is warm and dry.    ED Course  Procedures (including critical care time) Labs Review Labs Reviewed  POCT URINALYSIS DIP (DEVICE) - Abnormal; Notable for the following:    Hgb urine dipstick TRACE (*)    Nitrite POSITIVE (*)    Leukocytes, UA SMALL (*)    All other components within normal limits  POCT PREGNANCY, URINE  CERVICOVAGINAL ANCILLARY ONLY   Imaging Review No results  found.  EKG Interpretation    Date/Time:    Ventricular Rate:    PR Interval:    QRS Duration:   QT Interval:    QTC Calculation:   R Axis:     Text Interpretation:              MDM  Pt became very hostile during encounter that she wanted to be checked for std tonight when she has an appt on mon with gyn, but she says if she doesn't have her card even though she has a number she won't be seen and she doesn't want 2 visits, she continues to harp on no guarantee about not having a card and she wants to be checked tonight since she is here. Pt was  examined.    Linna Hoff, MD 04/05/13 2122  Linna Hoff, MD 04/05/13 1610  Linna Hoff, MD 04/05/13 [phone]

## 2013-04-05 NOTE — Discharge Instructions (Signed)
Take all of medicine as directed, drink lots of fluids, see dr Gaynell Facemarshall next week as planned

## 2013-04-05 NOTE — ED Notes (Signed)
Seen by dr kindl 

## 2013-04-10 ENCOUNTER — Telehealth (HOSPITAL_COMMUNITY): Payer: Self-pay | Admitting: *Deleted

## 2013-04-10 MED ORDER — METRONIDAZOLE 500 MG PO TABS: 500.0000 mg | ORAL_TABLET | Freq: Two times a day (BID) | ORAL | Status: DC

## 2013-04-10 NOTE — ED Notes (Signed)
Pt. called in for her lab results.  Pt. verified x 2 and given results.  (GC/Chlamydia neg., Affirm: Candida neg., Gardnerella and Trich pos.) No meds ordered. I told pt. I would get an order for Flagyl. Pt .wants Rx. Sent to the [facility] on [address] She said they close at 10:00.  Pt. instructed to no alcohol while taking this medication.  Pt. instructed to notify her partner to be treated with Flagyl, no sex until she has finished her medication and her partner has been treated and to practice safe sex. You can get HIV testing at the [facility], by appointment.  Pt. voiced understanding. Discussed with Dr. Lorenz CoasterKeller and he said he would e-prescribe the Rx.of Flagyl now to her pharmacy. Adrienne Turner, Adrienne Turner M 04/10/2013

## 2013-04-10 NOTE — ED Provider Notes (Signed)
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 She has been called and informed of this result. We will send a prescription for metronidazole 500 mg twice a day, #14, to the [facility] pharmacy on [address]  Reuben Likesavid C Toshua Honsinger, MD 04/10/13 [phone]

## 2013-10-02 ENCOUNTER — Emergency Department (HOSPITAL_COMMUNITY)
Admission: EM | Admit: 2013-10-02 | Discharge: 2013-10-02 | Disposition: A | Discharge status: Home / Self Care | Payer: Medicaid Other | Attending: Emergency Medicine | Admitting: Emergency Medicine

## 2013-10-02 ENCOUNTER — Encounter (HOSPITAL_COMMUNITY): Payer: Self-pay | Admitting: Emergency Medicine

## 2013-10-02 DIAGNOSIS — R0602 Shortness of breath: Secondary | ICD-10-CM | POA: Diagnosis present

## 2013-10-02 DIAGNOSIS — Z79899 Other long term (current) drug therapy: Secondary | ICD-10-CM | POA: Insufficient documentation

## 2013-10-02 DIAGNOSIS — Z0000 Encounter for general adult medical examination without abnormal findings: Secondary | ICD-10-CM | POA: Diagnosis not present

## 2013-10-02 DIAGNOSIS — J3489 Other specified disorders of nose and nasal sinuses: Secondary | ICD-10-CM | POA: Insufficient documentation

## 2013-10-02 DIAGNOSIS — Z792 Long term (current) use of antibiotics: Secondary | ICD-10-CM | POA: Diagnosis not present

## 2013-10-02 NOTE — Discharge Instructions (Signed)
RECOMMEND BENADRYL IF SYMPTOMS RECUR, AND RECHECK IN THE EMERGENCY DEPARTMENT.

## 2013-10-02 NOTE — ED Provider Notes (Signed)
Medical screening examination/treatment/procedure(s) were performed by non-physician practitioner and as supervising physician I was immediately available for consultation/collaboration.   EKG Interpretation None       Olivia Mackielga M Blimy Napoleon, MD 10/02/13 [phone]

## 2013-10-02 NOTE — ED Provider Notes (Signed)
CSN: [id number]     Arrival date & time 10/02/13  [id number] History   First MD Initiated Contact with Patient 10/02/13 [phone]     Chief Complaint  Patient presents with  . Shortness of Breath  . Nasal Congestion     (Consider location/radiation/quality/duration/timing/severity/associated sxs/prior Treatment) HPI Adrienne Turner is a 28 y.o. female who presents to the [facility] ER with the initial complaint of SOB.  The patient woke up this morning with the sensation her throat was closing up on her.  She went to the mirror and found "that thing (uvula) was red and touching my tongue."  She drank some water with moderate relief of symptoms.  Symptoms did not return the rest of the morning and she feels well in the ED.  She reports feeling normal the night before.  She denies being allergic to any medications or seasonal allergies.  She denies any chest pain, any syncopal symptoms.  History reviewed. No pertinent past medical history. History reviewed. No pertinent past surgical history. History reviewed. No pertinent family history. History  Substance Use Topics  . Smoking status: Never Smoker   . Smokeless tobacco: Never Used  . Alcohol Use: No   OB History   Grav Para Term Preterm Abortions TAB SAB Ect Mult Living   4 2   2 2          Review of Systems  Constitutional: Negative for fever, chills and diaphoresis.  HENT: Negative for postnasal drip, rhinorrhea, sinus pressure, sneezing, trouble swallowing and voice change.        Itchy throat   Eyes: Negative for photophobia and redness.  Respiratory: Positive for shortness of breath. Negative for choking and chest tightness.   Cardiovascular: Negative.   Gastrointestinal: Negative.  Negative for nausea, abdominal pain, diarrhea and constipation.  Genitourinary: Negative.   Musculoskeletal: Negative.   Skin: Negative.   Neurological: Negative.       Allergies  Review of patient's allergies indicates no known allergies.  Home  Medications   Prior to Admission medications   Medication Sig Start Date End Date Taking? Authorizing Provider  cephALEXin (KEFLEX) 500 MG capsule Take 1 capsule (500 mg total) by mouth 4 (four) times daily. Take all of medicine and drink lots of fluids 04/05/13   Linna HoffJames D Kindl, MD  ibuprofen (ADVIL,MOTRIN) 800 MG tablet Take 800 mg by mouth every 8 (eight) hours as needed for pain.    Historical Provider, MD  methylergonovine (METHERGINE) 0.2 MG tablet Take 1 tablet (0.2 mg total) by mouth every 6 (six) hours. 12/14/12   Jean RosenthalSusan P Lineberry, NP  metroNIDAZOLE (FLAGYL) 500 MG tablet Take 1 tablet (500 mg total) by mouth 2 (two) times daily. 04/10/13   Reuben Likesavid C Keller, MD   BP 121/80  Pulse 80  Temp(Src) 98.1 F (36.7 C) (Oral)  Resp 15  Ht 5\' 6"  (1.676 m)  Wt 170 lb (77.111 kg)  BMI 27.45 kg/m2  SpO2 100% Physical Exam  Constitutional: She appears well-developed and well-nourished.  HENT:  Head: Normocephalic.  Mouth/Throat: Uvula is midline, oropharynx is clear and moist and mucous membranes are normal. No oropharyngeal exudate, posterior oropharyngeal edema, posterior oropharyngeal erythema or tonsillar abscesses.  No uvular swelling or redness.   Eyes: Conjunctivae are normal. Pupils are equal, round, and reactive to light. Right eye exhibits no discharge. Left eye exhibits no discharge.  Neck: Normal range of motion. Neck supple. No tracheal deviation present.  Cardiovascular: Normal rate, regular rhythm, normal heart sounds and  intact distal pulses.   Pulmonary/Chest: Effort normal and breath sounds normal. No respiratory distress. She has no wheezes. She exhibits no tenderness.  Abdominal: Soft. Bowel sounds are normal. There is no tenderness. There is no rebound and no guarding.  Musculoskeletal: Normal range of motion.  Lymphadenopathy:    She has no cervical adenopathy.  Neurological: She is alert. No cranial nerve deficit.  Skin: Skin is warm and dry. No rash noted.  Psychiatric:  She has a normal mood and affect.    ED Course  Procedures (including critical care time) Labs Review Labs Reviewed - No data to display  Imaging Review No results found.   EKG Interpretation None      MDM   Final diagnoses:  None  1. Normal physical exam  Discussed possible causes of SOB and sensation of throat swelling. Recommended using Benadryl and return to ED if symptoms recur.    Arnoldo HookerShari A Kevron Patella, PA-C 10/02/13 [phone]

## 2013-10-02 NOTE — ED Notes (Signed)
Pt sts that she woke from sleep and felt like she couldn't breath and like her uvula was swollen.

## 2013-11-30 IMAGING — US US OB COMP LESS 14 WK
1 series · 14 of 20 positions shown · non-contrast
Comparison: none

[Series 1: us ob comp less 14 wks · 14 of 20 slices shown]
[im 1/20]
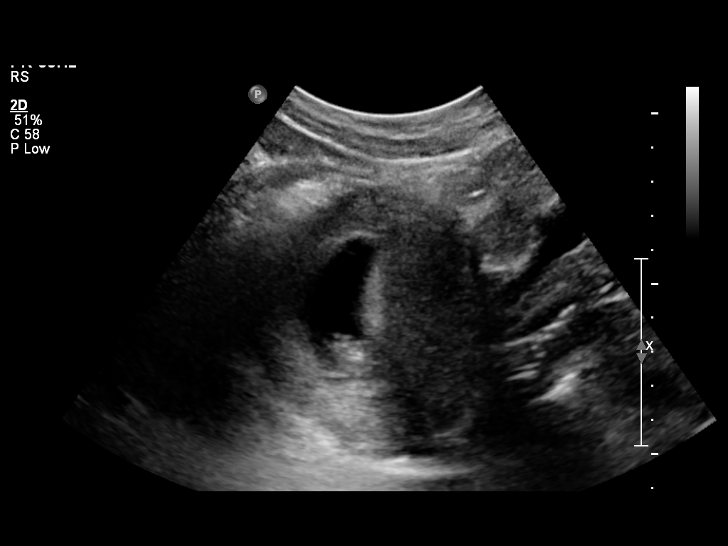
[im 3/20]
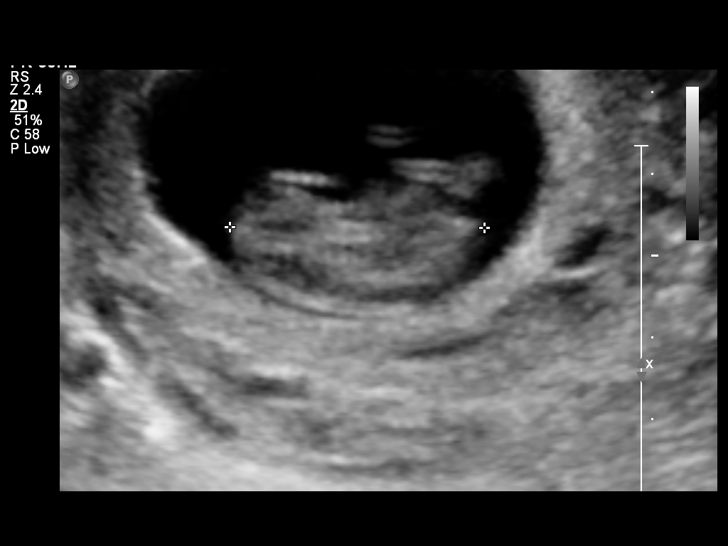
[im 4/20]
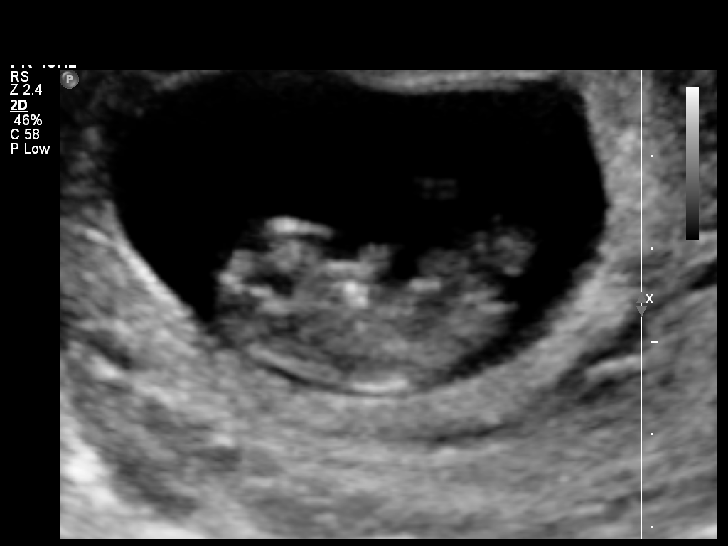
[im 6/20]
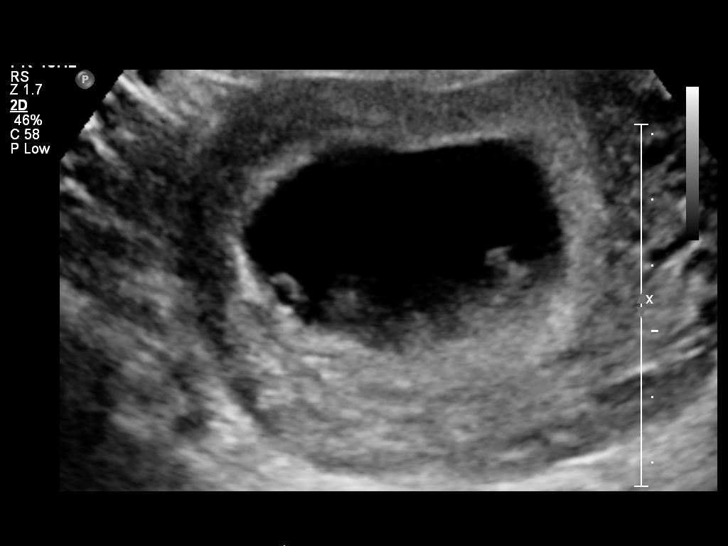
[im 7/20]
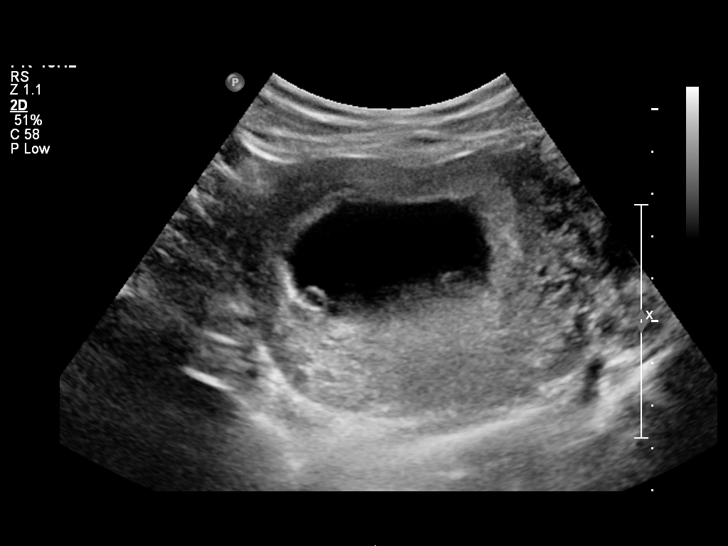
[im 8/20]
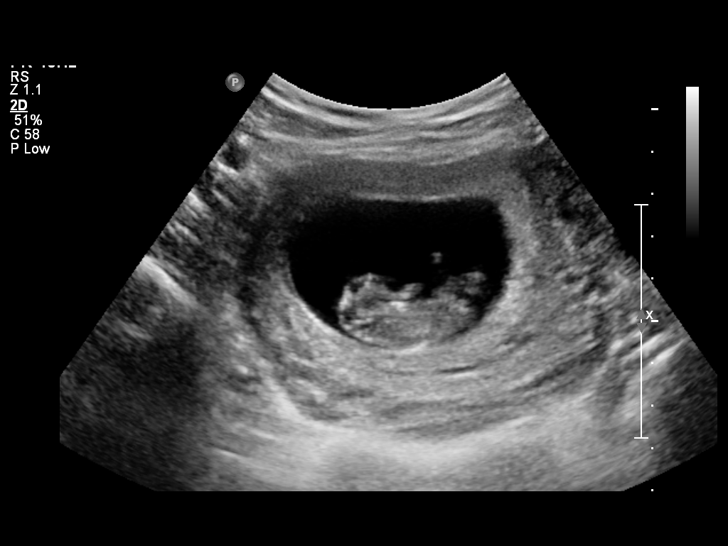
[im 10/20]
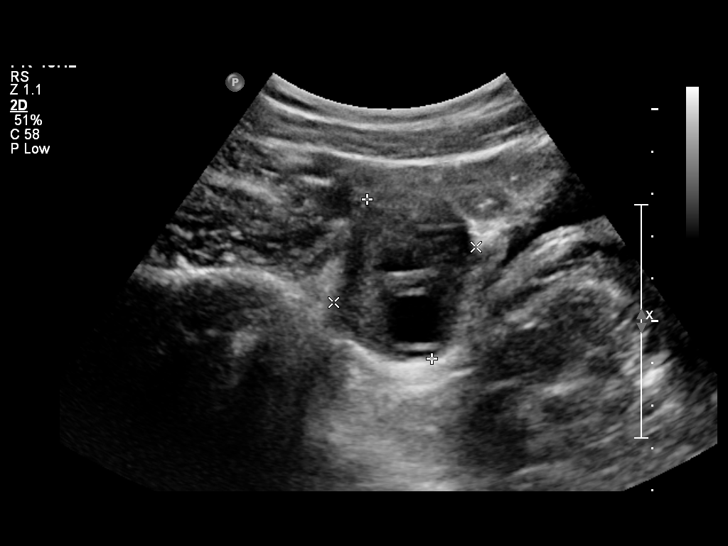
[im 11/20]
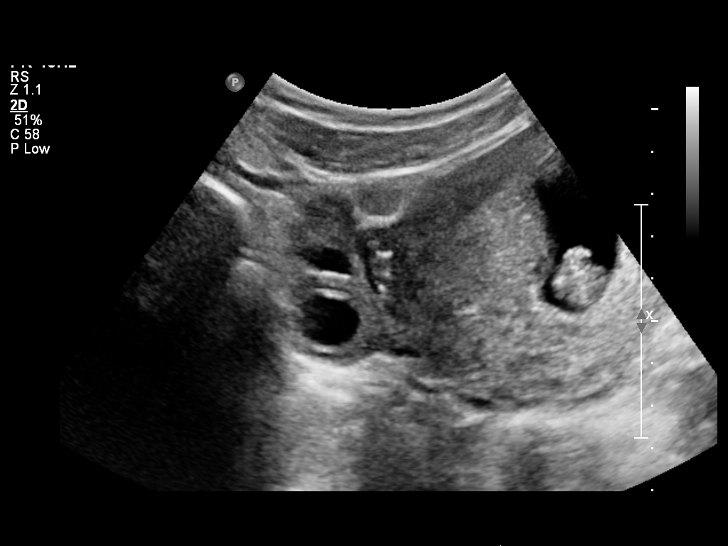
[im 13/20]
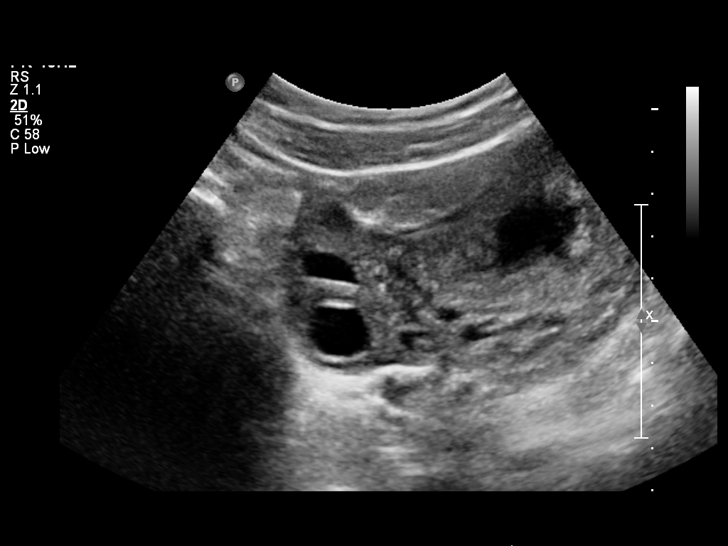
[im 14/20]
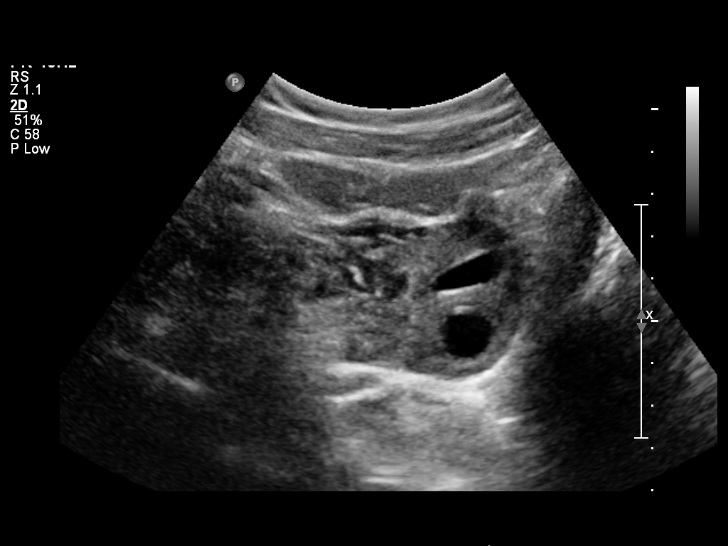
[im 16/20]
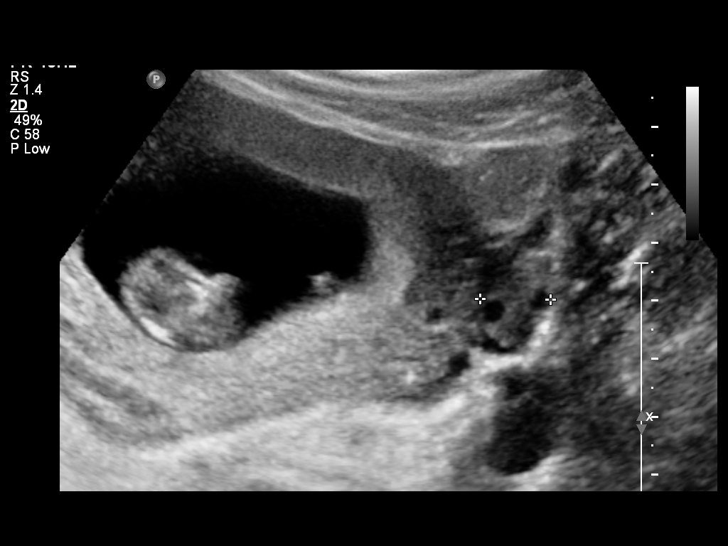
[im 17/20]
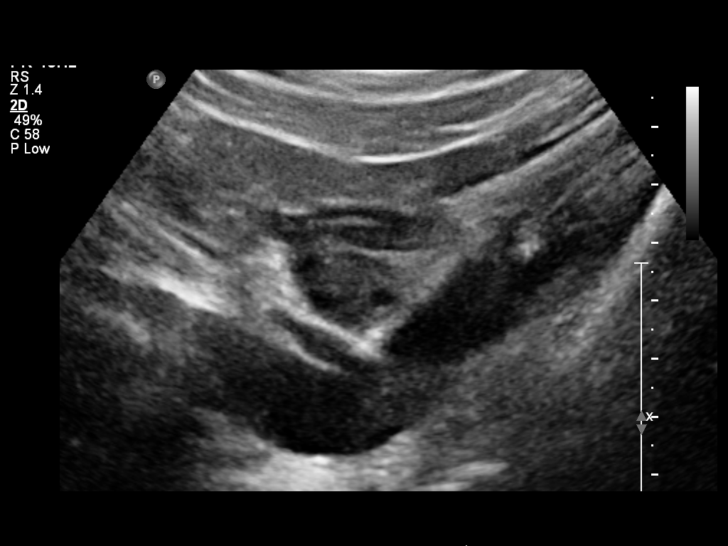
[im 18/20]
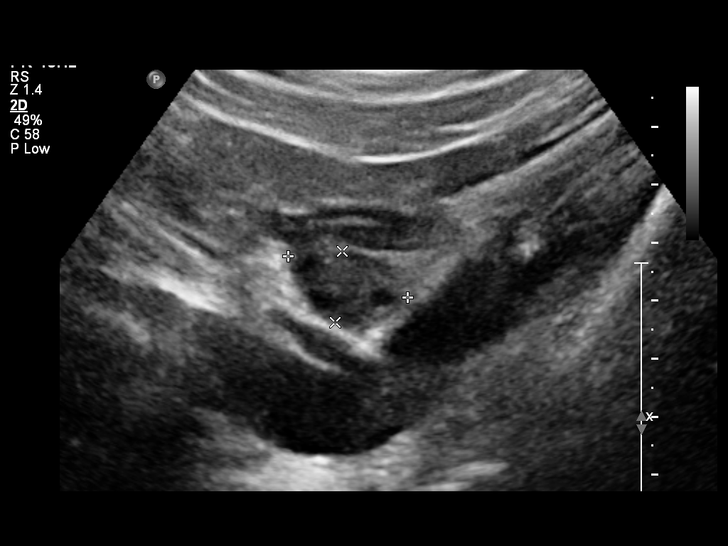
[im 20/20]
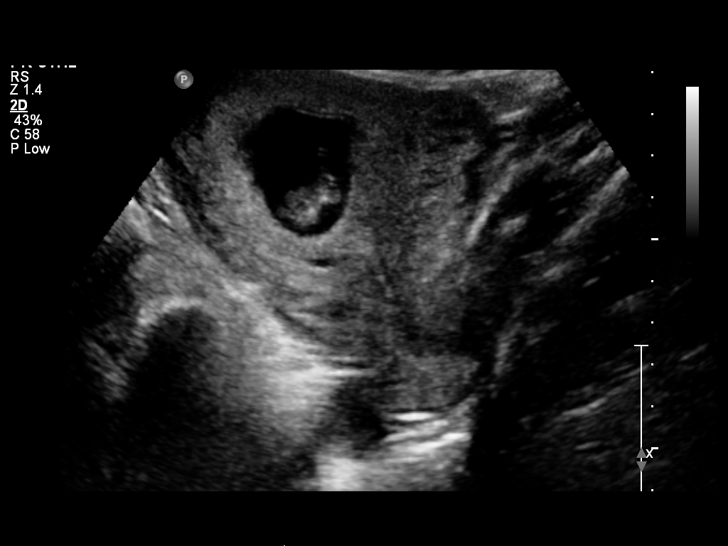

[14 of 20 positions shown; findings below may reference images not displayed]

OBSTETRICS REPORT
                      (Signed Final 01/24/2012 [DATE])

Service(s) Provided

 US OB COMP LESS 14 WKS                                76801.0
Indications

 Uncertain LMP;  Establish Gestational [AGE]
Fetal Evaluation

 Num Of Fetuses:    1
 Gest. Sac:         Intrauterine
 Yolk Sac:          Visualized
 Fetal Pole:        Visualized
 Fetal Heart Rate:  181                         bpm
 Cardiac Activity:  Observed

 Amniotic Fluid
 AFI FV:      Subjectively within normal limits
Biometry

 CRL:       31  mm    G. Age:   9w 6d                  EDD:   08/22/12
Gestational Age

 Best:          9w 6d     Det. By:   U/S C R L (01/24/12)     EDD:   08/22/12
Cervix Uterus Adnexa

 Cervix:       Normal appearance by transabdominal scan.
 Left Ovary:   Within normal limits.
 Right Ovary:  Within normal limits.

 Adnexa:     No abnormality visualized.
Impression

 Single living IUP with US Gest. Age of 9w 6d, and EDD of
 08/22/2012.
 No significant maternal uterine or adnexal abnormality
 identified.

 questions or concerns.

## 2014-01-28 ENCOUNTER — Encounter (HOSPITAL_COMMUNITY): Payer: Self-pay | Admitting: Emergency Medicine

## 2014-04-28 ENCOUNTER — Encounter (HOSPITAL_COMMUNITY): Payer: Self-pay | Admitting: *Deleted

## 2014-04-28 ENCOUNTER — Emergency Department (HOSPITAL_COMMUNITY)
Admission: EM | Admit: 2014-04-28 | Discharge: 2014-04-28 | Disposition: A | Discharge status: Home / Self Care | Payer: Medicaid Other | Source: Home / Self Care | Attending: Family Medicine | Admitting: Family Medicine

## 2014-04-28 DIAGNOSIS — L42 Pityriasis rosea: Secondary | ICD-10-CM

## 2014-04-28 NOTE — ED Provider Notes (Signed)
CSN: [id number]     Arrival date & time 04/28/14  0910 History   First MD Initiated Contact with Patient 04/28/14 [phone]     Chief Complaint  Patient presents with  . Rash   (Consider location/radiation/quality/duration/timing/severity/associated sxs/prior Treatment) Patient is a 29 y.o. female presenting with rash. The history is provided by the patient.  Rash Location:  Torso Quality: dryness, peeling, redness and scaling   Quality: not itchy   Severity:  Mild Duration:  1 week Progression:  Unchanged Chronicity:  New Context: not exposure to similar rash   Relieved by:  None tried Worsened by:  Nothing tried Ineffective treatments:  None tried Associated symptoms: no abdominal pain, no diarrhea, no fever, no nausea and no sore throat     No past medical history on file. No past surgical history on file. No family history on file. History  Substance Use Topics  . Smoking status: Never Smoker   . Smokeless tobacco: Never Used  . Alcohol Use: No   OB History    Gravida Para Term Preterm AB TAB SAB Ectopic Multiple Living   4 2   2 2          Review of Systems  Constitutional: Negative.  Negative for fever.  HENT: Negative for sore throat.   Gastrointestinal: Negative for nausea, abdominal pain and diarrhea.  Skin: Positive for rash.    Allergies  Review of patient's allergies indicates no known allergies.  Home Medications   Prior to Admission medications   Medication Sig Start Date End Date Taking? Authorizing Provider  cephALEXin (KEFLEX) 500 MG capsule Take 1 capsule (500 mg total) by mouth 4 (four) times daily. Take all of medicine and drink lots of fluids 04/05/13   Linna HoffJames D Kindl, MD  ibuprofen (ADVIL,MOTRIN) 800 MG tablet Take 800 mg by mouth every 8 (eight) hours as needed for pain.    Historical Provider, MD  methylergonovine (METHERGINE) 0.2 MG tablet Take 1 tablet (0.2 mg total) by mouth every 6 (six) hours. 12/14/12   Jean RosenthalSusan P Lineberry, NP  metroNIDAZOLE  (FLAGYL) 500 MG tablet Take 1 tablet (500 mg total) by mouth 2 (two) times daily. 04/10/13   Reuben Likesavid C Keller, MD   BP 116/78 mmHg  Pulse 72  Temp(Src) 99.1 F (37.3 C) (Oral)  Resp 12  SpO2 100% Physical Exam  Constitutional: She is oriented to person, place, and time. She appears well-developed and well-nourished.  HENT:  Mouth/Throat: Oropharynx is clear and moist.  Neck: Normal range of motion. Neck supple.  Neurological: She is alert and oriented to person, place, and time.  Skin: Skin is warm and dry. Rash noted.  Diffuse oval peripheral scale, sl hyperpig lesions on trunk, poss herald patch. In nad, no pustules or vesicles.  Nursing note and vitals reviewed.   ED Course  Procedures (including critical care time) Labs Review Labs Reviewed - No data to display  Imaging Review No results found.   MDM   1. Pityriasis rosea       Linna HoffJames D Kindl, MD 04/28/14 1002

## 2014-04-28 NOTE — ED Notes (Signed)
Pt reports  Symptoms  Of  A  Rash  On  Torso    That  Started  About  10  Days  Ago  And  Is  Getting  Worse    -     Does  Not  Really  Itch  That  much

## 2014-10-21 IMAGING — US US PELVIS COMPLETE
1 series · 14 of 25 positions shown · non-contrast
Comparison: None

CLINICAL DATA: Heavy bleeding. Post abortion 12/08/2012.

EXAM:
TRANSABDOMINAL AND TRANSVAGINAL ULTRASOUND OF PELVIS
TECHNIQUE: Both transabdominal and transvaginal ultrasound examinations of the
pelvis were performed. Transabdominal technique was performed for
global imaging of the pelvis including uterus, ovaries, adnexal
regions, and pelvic cul-de-sac. It was necessary to proceed with
endovaginal exam following the transabdominal exam to visualize the
uterus, endometrium and ovaries..

[Series 1: us pelvis complete · 42 acquisitions, 14 frames shown]
[im 1/42]
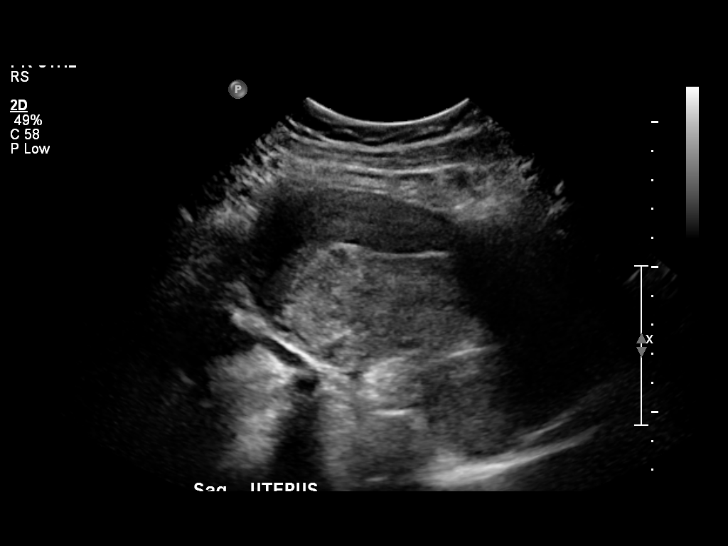
[im 4/42]
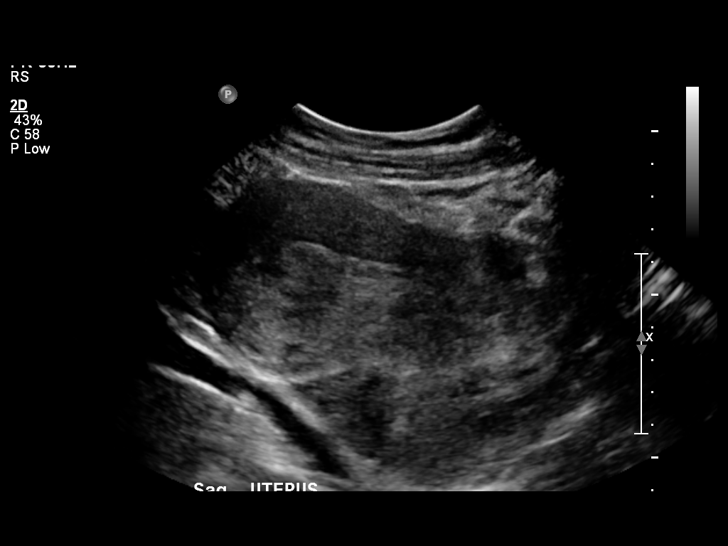
[im 7/42]
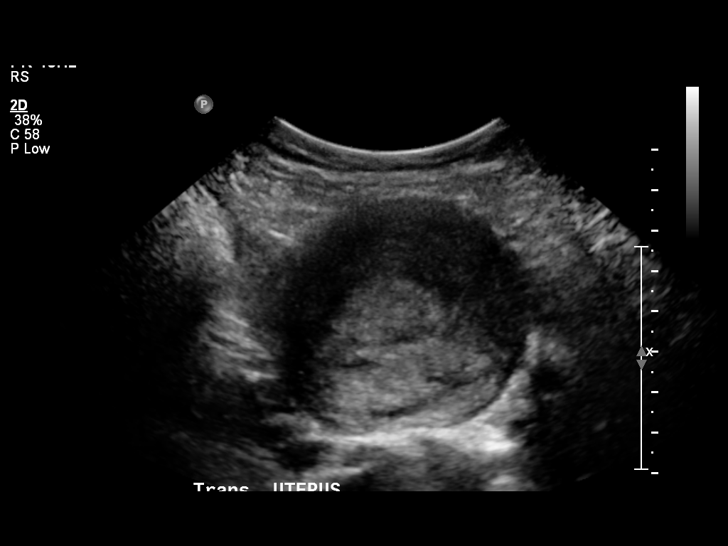
[im 11/42]
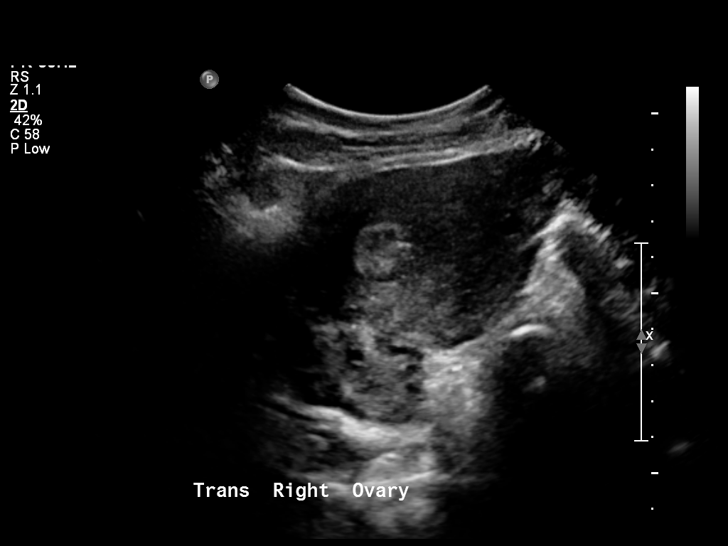
[im 14/42]
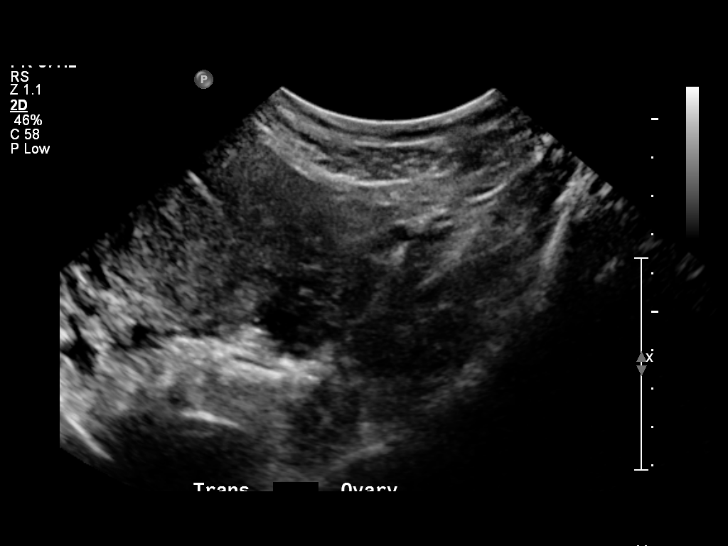
[im 16/42]
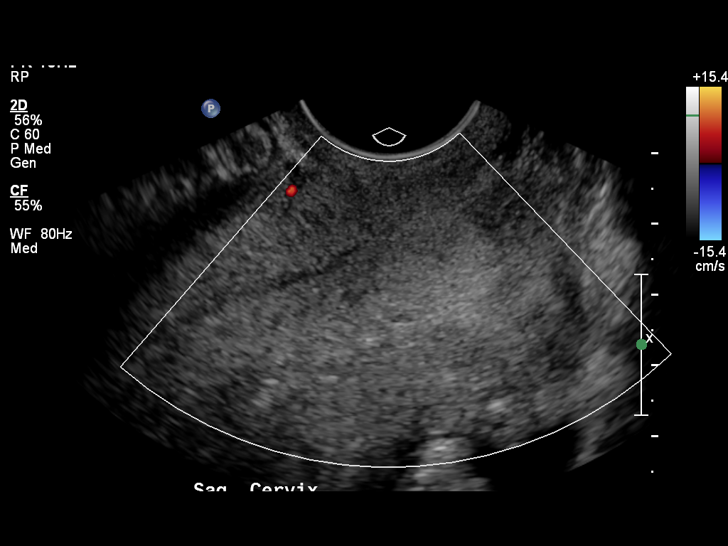
[im 19/42]
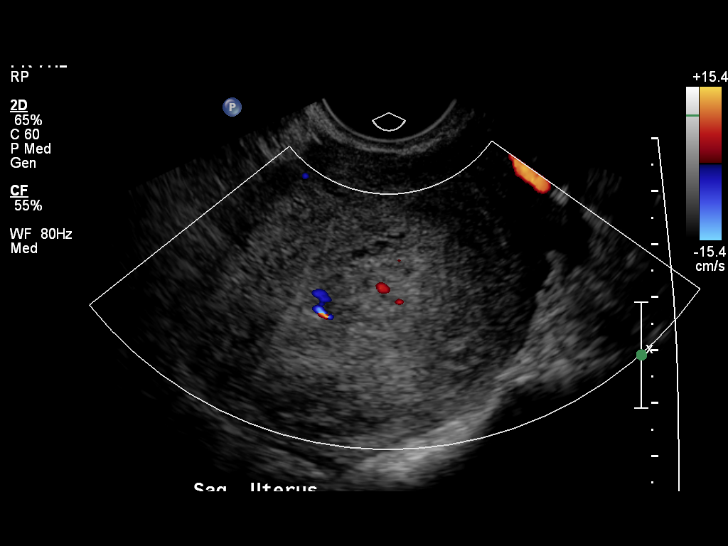
[im 23/42]
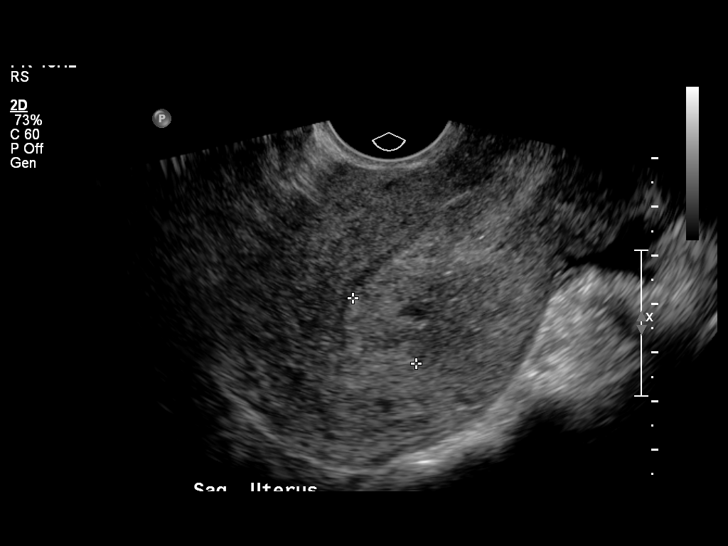
[im 26/42]
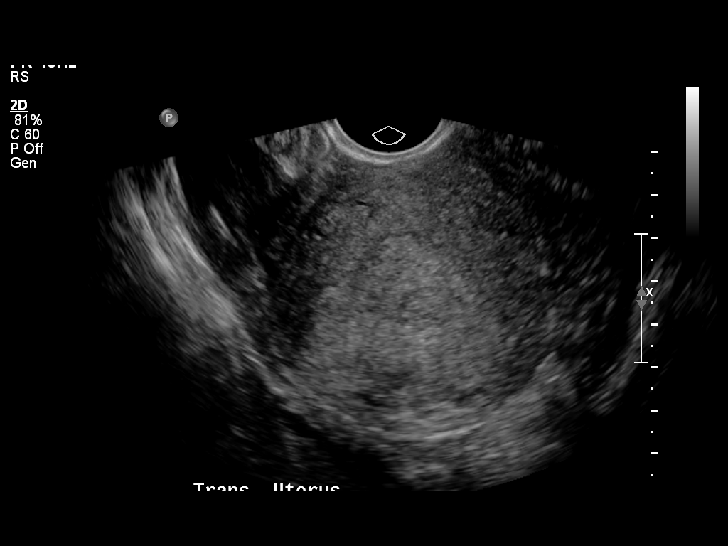
[im 28/42]
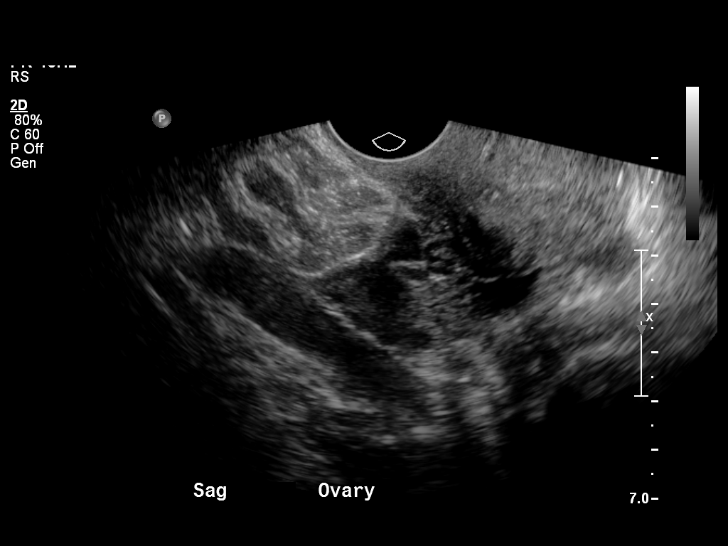
[im 31/42]
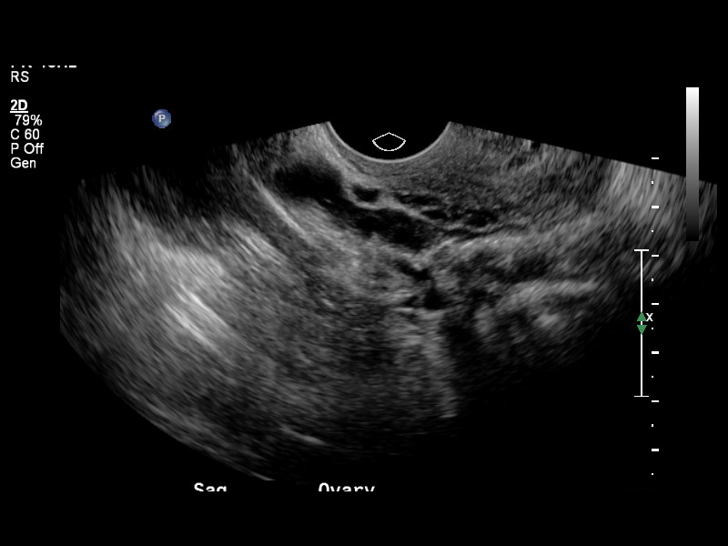
[im 35/42]
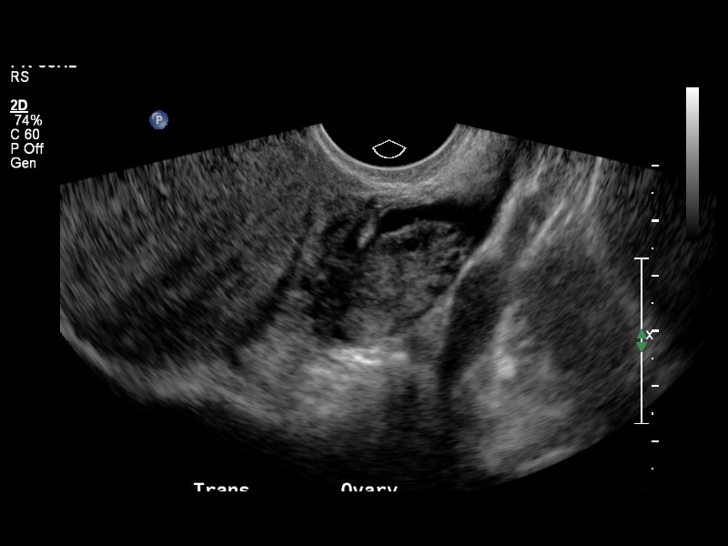
[im 38/42]
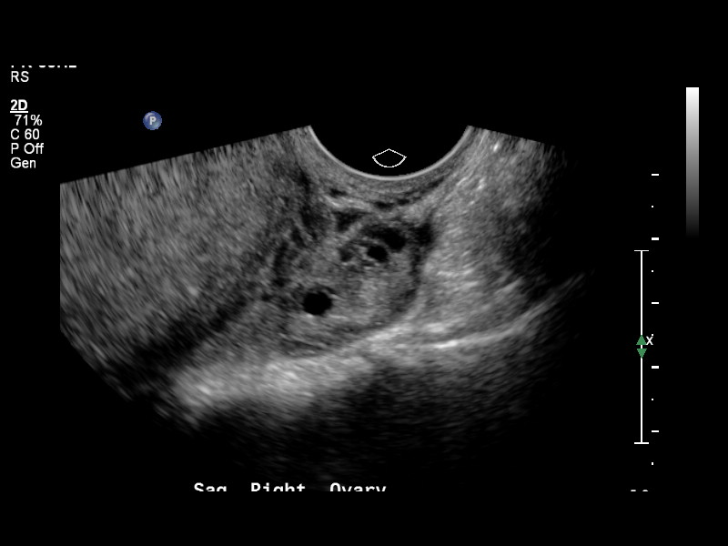
[im 42/42]
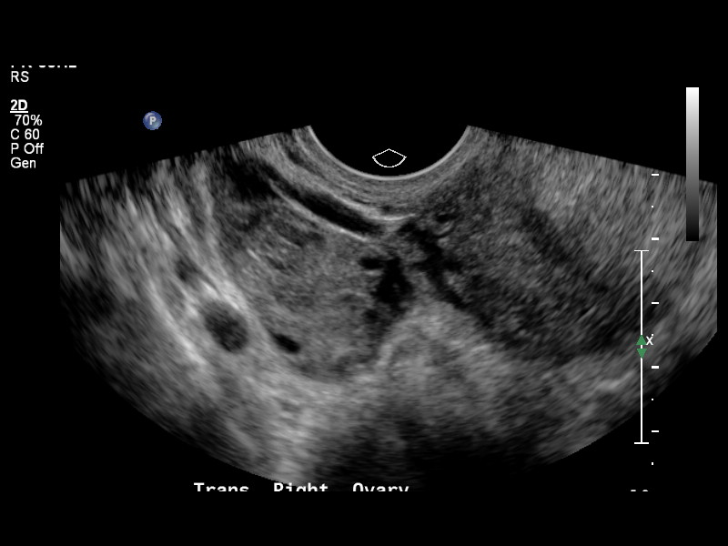

[14 of 25 positions shown; findings below may reference images not displayed]

FINDINGS: Uterus

Measurements: 10.6 x 6.0 x 6.8 cm. No fibroids or other mass
visualized.

Endometrium

Thickness: Thickened, 19 mm. Heterogeneous material within the
endometrium with internal blood flow measures 3.5 x 2.8 x 2.1 cm
compatible with retained products of conception.

Right ovary

Measurements: 3.3 x 1.6 x 1.8 cm. Normal appearance/no adnexal mass.

Left ovary

Measurements: 4.1 x 1.7 x 2.2 cm. Normal appearance/no adnexal mass.

Other findings

Trace free fluid in the pelvis.
IMPRESSION: Heterogeneous material within the thickened endometrium which
contains internal blood flow compatible with retained products of
conception.

## 2015-03-30 NOTE — L&D Delivery Note (Signed)
Delivery Note At  a viable female was delivered via  (Presentation: vertex, OA).  APGAR: 9,9  Placenta status: delivered spontaneously, intact  Cord: 3 vessel,1 body and 1 nuchal loop  Anesthesia: Epidural     Episiotomy: NA  Lacerations: NA Suture Repair: NA Est. Blood Loss (mL):  150  Mom to postpartum.  Baby to Couplet care / Skin to Skin.  Andres Ege, MD, PGY-1, MPH 10/24/2015, 6:06 PM  Patient is a [id number] at [redacted]w[redacted]d who was admitted in SOL- latent, uncomplicated prenatal course.  She progressed with augmentation via AROM.  I was gloved and present for delivery in its entirety.  Second stage of labor progressed, baby delivered after 5-6 contractions.  Mild decels during second stage noted.  Complications: none  Lacerations: none  EBL: 150cc  Cam Hai, CNM 6:40 PM  10/24/2015

## 2015-04-14 ENCOUNTER — Encounter (HOSPITAL_COMMUNITY): Payer: Self-pay | Admitting: *Deleted

## 2015-04-14 ENCOUNTER — Inpatient Hospital Stay (HOSPITAL_COMMUNITY)
Admission: AD | Admit: 2015-04-14 | Discharge: 2015-04-14 | Disposition: A | Discharge status: Home / Self Care | Payer: Medicaid Other | Source: Ambulatory Visit | Attending: Family Medicine | Admitting: Family Medicine

## 2015-04-14 DIAGNOSIS — B9689 Other specified bacterial agents as the cause of diseases classified elsewhere: Secondary | ICD-10-CM

## 2015-04-14 DIAGNOSIS — Z3A01 Less than 8 weeks gestation of pregnancy: Secondary | ICD-10-CM | POA: Diagnosis not present

## 2015-04-14 DIAGNOSIS — O2341 Unspecified infection of urinary tract in pregnancy, first trimester: Secondary | ICD-10-CM

## 2015-04-14 DIAGNOSIS — R109 Unspecified abdominal pain: Secondary | ICD-10-CM | POA: Diagnosis present

## 2015-04-14 DIAGNOSIS — O23591 Infection of other part of genital tract in pregnancy, first trimester: Secondary | ICD-10-CM | POA: Diagnosis not present

## 2015-04-14 DIAGNOSIS — N760 Acute vaginitis: Secondary | ICD-10-CM | POA: Insufficient documentation

## 2015-04-14 DIAGNOSIS — A499 Bacterial infection, unspecified: Secondary | ICD-10-CM

## 2015-04-14 HISTORY — DX: Other specified health status: Z78.9

## 2015-04-14 LAB — WET PREP, GENITAL
SPERM: NONE SEEN
Trich, Wet Prep: NONE SEEN
YEAST WET PREP: NONE SEEN

## 2015-04-14 LAB — URINALYSIS, ROUTINE W REFLEX MICROSCOPIC
Bilirubin Urine: NEGATIVE
Glucose, UA: NEGATIVE mg/dL
KETONES UR: NEGATIVE mg/dL
LEUKOCYTES UA: NEGATIVE
Nitrite: POSITIVE — AB
PROTEIN: NEGATIVE mg/dL
Specific Gravity, Urine: 1.025 (ref 1.005–1.030)
pH: 6 (ref 5.0–8.0)

## 2015-04-14 LAB — POCT PREGNANCY, URINE: PREG TEST UR: POSITIVE — AB

## 2015-04-14 LAB — URINE MICROSCOPIC-ADD ON

## 2015-04-14 MED ORDER — METRONIDAZOLE 500 MG PO TABS: 500.0000 mg | ORAL_TABLET | Freq: Two times a day (BID) | ORAL | Status: DC

## 2015-04-14 MED ORDER — CEPHALEXIN 500 MG PO CAPS: 500.0000 mg | ORAL_CAPSULE | Freq: Four times a day (QID) | ORAL | Status: DC

## 2015-04-14 NOTE — MAU Note (Signed)
Pt states has been having occasional cramping and abdominal pain for "a couple of weeks" and a pain of 4/10.

## 2015-04-14 NOTE — Discharge Instructions (Signed)
Bacterial Vaginosis Bacterial vaginosis is an infection of the vagina. It happens when too many germs (bacteria) grow in the vagina. Having this infection puts you at risk for getting other infections from sex. Treating this infection can help lower your risk for other infections, such as:   Chlamydia.  Gonorrhea.  HIV.  Herpes. HOME CARE  Take your medicine as told by your doctor.  Finish your medicine even if you start to feel better.  Tell your sex partner that you have an infection. They should see their doctor for treatment.  During treatment:  Avoid sex or use condoms correctly.  Do not douche.  Do not drink alcohol unless your doctor tells you it is ok.  Do not breastfeed unless your doctor tells you it is ok. GET HELP IF:  You are not getting better after 3 days of treatment.  You have more grey fluid (discharge) coming from your vagina than before.  You have more pain than before.  You have a fever. MAKE SURE YOU:   Understand these instructions.  Will watch your condition.  Will get help right away if you are not doing well or get worse.   This information is not intended to replace advice given to you by your health care provider. Make sure you discuss any questions you have with your health care provider.   Document Released: 12/23/2007 Document Revised: 04/05/2014 Document Reviewed: 10/25/2012 Elsevier Interactive Patient Education 2016 [employer] First Trimester of Pregnancy The first trimester of pregnancy is from week 1 until the end of week 12 (months 1 through 3). A week after a sperm fertilizes an egg, the egg will implant on the wall of the uterus. This embryo will begin to develop into a baby. Genes from you and your partner are forming the baby. The female genes determine whether the baby is a boy or a girl. At 6-8 weeks, the eyes and face are formed, and the heartbeat can be seen on ultrasound. At the end of 12 weeks, all the baby's organs are  formed.  Now that you are pregnant, you will want to do everything you can to have a healthy baby. Two of the most important things are to get good prenatal care and to follow your health care provider's instructions. Prenatal care is all the medical care you receive before the baby's birth. This care will help prevent, find, and treat any problems during the pregnancy and childbirth. BODY CHANGES Your body goes through many changes during pregnancy. The changes vary from woman to woman.   You may gain or lose a couple of pounds at first.  You may feel sick to your stomach (nauseous) and throw up (vomit). If the vomiting is uncontrollable, call your health care provider.  You may tire easily.  You may develop headaches that can be relieved by medicines approved by your health care provider.  You may urinate more often. Painful urination may mean you have a bladder infection.  You may develop heartburn as a result of your pregnancy.  You may develop constipation because certain hormones are causing the muscles that push waste through your intestines to slow down.  You may develop hemorrhoids or swollen, bulging veins (varicose veins).  Your breasts may begin to grow larger and become tender. Your nipples may stick out more, and the tissue that surrounds them (areola) may become darker.  Your gums may bleed and may be sensitive to brushing and flossing.  Dark spots or blotches (chloasma, mask  of pregnancy) may develop on your face. This will likely fade after the baby is born.  Your menstrual periods will stop.  You may have a loss of appetite.  You may develop cravings for certain kinds of food.  You may have changes in your emotions from day to day, such as being excited to be pregnant or being concerned that something may go wrong with the pregnancy and baby.  You may have more vivid and strange dreams.  You may have changes in your hair. These can include thickening of your  hair, rapid growth, and changes in texture. Some women also have hair loss during or after pregnancy, or hair that feels dry or thin. Your hair will most likely return to normal after your baby is born. WHAT TO EXPECT AT YOUR PRENATAL VISITS During a routine prenatal visit:  You will be weighed to make sure you and the baby are growing normally.  Your blood pressure will be taken.  Your abdomen will be measured to track your baby's growth.  The fetal heartbeat will be listened to starting around week 10 or 12 of your pregnancy.  Test results from any previous visits will be discussed. Your health care provider may ask you:  How you are feeling.  If you are feeling the baby move.  If you have had any abnormal symptoms, such as leaking fluid, bleeding, severe headaches, or abdominal cramping.  If you are using any tobacco products, including cigarettes, chewing tobacco, and electronic cigarettes.  If you have any questions. Other tests that may be performed during your first trimester include:  Blood tests to find your blood type and to check for the presence of any previous infections. They will also be used to check for low iron levels (anemia) and Rh antibodies. Later in the pregnancy, blood tests for diabetes will be done along with other tests if problems develop.  Urine tests to check for infections, diabetes, or protein in the urine.  An ultrasound to confirm the proper growth and development of the baby.  An amniocentesis to check for possible genetic problems.  Fetal screens for spina bifida and Down syndrome.  You may need other tests to make sure you and the baby are doing well.  HIV (human immunodeficiency virus) testing. Routine prenatal testing includes screening for HIV, unless you choose not to have this test. HOME CARE INSTRUCTIONS  Medicines  Follow your health care provider's instructions regarding medicine use. Specific medicines may be either safe or  unsafe to take during pregnancy.  Take your prenatal vitamins as directed.  If you develop constipation, try taking a stool softener if your health care provider approves. Diet  Eat regular, well-balanced meals. Choose a variety of foods, such as meat or vegetable-based protein, fish, milk and low-fat dairy products, vegetables, fruits, and whole grain breads and cereals. Your health care provider will help you determine the amount of weight gain that is right for you.  Avoid raw meat and uncooked cheese. These carry germs that can cause birth defects in the baby.  Eating four or five small meals rather than three large meals a day may help relieve nausea and vomiting. If you start to feel nauseous, eating a few soda crackers can be helpful. Drinking liquids between meals instead of during meals also seems to help nausea and vomiting.  If you develop constipation, eat more high-fiber foods, such as fresh vegetables or fruit and whole grains. Drink enough fluids to keep your urine clear  clear or pale yellow. °Activity and Exercise °· Exercise only as directed by your health care provider. Exercising will help you: °¨ Control your weight. °¨ Stay in shape. °¨ Be prepared for labor and delivery. °· Experiencing pain or cramping in the lower abdomen or low back is a good sign that you should stop exercising. Check with your health care provider before continuing normal exercises. °· Try to avoid standing for long periods of time. Move your legs often if you must stand in one place for a long time. °· Avoid heavy lifting. °· Wear low-heeled shoes, and practice good posture. °· You may continue to have sex unless your health care provider directs you otherwise. °Relief of Pain or Discomfort °· Wear a good support bra for breast tenderness.   °· Take warm sitz baths to soothe any pain or discomfort caused by hemorrhoids. Use hemorrhoid cream if your health care provider approves.   °· Rest with your legs elevated if  you have leg cramps or low back pain. °· If you develop varicose veins in your legs, wear support hose. Elevate your feet for 15 minutes, 3-4 times a day. Limit salt in your diet. °Prenatal Care °· Schedule your prenatal visits by the twelfth week of pregnancy. They are usually scheduled monthly at first, then more often in the last 2 months before delivery. °· Write down your questions. Take them to your prenatal visits. °· Keep all your prenatal visits as directed by your health care provider. °Safety °· Wear your seat belt at all times when driving. °· Make a list of emergency phone numbers, including numbers for family, friends, the hospital, and police and fire departments. °General Tips °· Ask your health care provider for a referral to a local prenatal education class. Begin classes no later than at the beginning of month 6 of your pregnancy. °· Ask for help if you have counseling or nutritional needs during pregnancy. Your health care provider can offer advice or refer you to specialists for help with various needs. °· Do not use hot tubs, steam rooms, or saunas. °· Do not douche or use tampons or scented sanitary pads. °· Do not cross your legs for long periods of time. °· Avoid cat litter boxes and soil used by cats. These carry germs that can cause birth defects in the baby and possibly loss of the fetus by miscarriage or stillbirth. °· Avoid all smoking, herbs, alcohol, and medicines not prescribed by your health care provider. Chemicals in these affect the formation and growth of the baby. °· Do not use any tobacco products, including cigarettes, chewing tobacco, and electronic cigarettes. If you need help quitting, ask your health care provider. You may receive counseling support and other resources to help you quit. °· Schedule a dentist appointment. At home, brush your teeth with a soft toothbrush and be gentle when you floss. °SEEK MEDICAL CARE IF:  °· You have dizziness. °· You have mild pelvic  cramps, pelvic pressure, or nagging pain in the abdominal area. °· You have persistent nausea, vomiting, or diarrhea. °· You have a bad smelling vaginal discharge. °· You have pain with urination. °· You notice increased swelling in your face, hands, legs, or ankles. °SEEK IMMEDIATE MEDICAL CARE IF:  °· You have a fever. °· You are leaking fluid from your vagina. °· You have spotting or bleeding from your vagina. °· You have severe abdominal cramping or pain. °· You have rapid weight gain or loss. °· You vomit blood or   material that looks like coffee grounds. °· You are exposed to German measles and have never had them. °· You are exposed to fifth disease or chickenpox. °· You develop a severe headache. °· You have shortness of breath. °· You have any kind of trauma, such as from a fall or a car accident. °  °This information is not intended to replace advice given to you by your health care provider. Make sure you discuss any questions you have with your health care provider. °  °Document Released: 03/09/2001 Document Revised: 04/05/2014 Document Reviewed: 01/23/2013 °Elsevier Interactive Patient Education ©2016 Else[employer] Inc. ° °

## 2015-04-14 NOTE — MAU Provider Note (Signed)
History     CSN: [id number]  Arrival date and time: 04/14/15 1345   First Provider Initiated Contact with Patient 04/14/15 1423      Chief Complaint  Patient presents with  . Abdominal Cramping   HPI   Ms.Adrienne Turner is a 30 y.o. [id number] at 5161w3d presenting to MAU for concerns about pregnancy. She missed her period this month, however has irregular periods.  She has had occasional lower abdominal pain, however none today. She denies vaginal bleeding.   She would like to know how far along she is today.   She was taking birth control pills at the time of pregnancy.   She plans to see Dr. Gaynell FaceMarshall for her prenatal care.   OB History    Gravida Para Term Preterm AB TAB SAB Ectopic Multiple Living   5 2   2 2           Past Medical History  Diagnosis Date  . Medical history non-contributory     Past Surgical History  Procedure Laterality Date  . No past surgeries      History reviewed. No pertinent family history.  Social History  Substance Use Topics  . Smoking status: Never Smoker   . Smokeless tobacco: Never Used  . Alcohol Use: No    Allergies: No Known Allergies  Prescriptions prior to admission  Medication Sig Dispense Refill Last Dose  . [DISCONTINUED] cephALEXin (KEFLEX) 500 MG capsule Take 1 capsule (500 mg total) by mouth 4 (four) times daily. Take all of medicine and drink lots of fluids 20 capsule 0   . [DISCONTINUED] methylergonovine (METHERGINE) 0.2 MG tablet Take 1 tablet (0.2 mg total) by mouth every 6 (six) hours. 6 tablet 0   . [DISCONTINUED] metroNIDAZOLE (FLAGYL) 500 MG tablet Take 1 tablet (500 mg total) by mouth 2 (two) times daily. 14 tablet 0    Results for orders placed or performed during the hospital encounter of 04/14/15 (from the past 48 hour(s))  Urinalysis, Routine w reflex microscopic ([facility])     Status: Abnormal   Collection Time: 04/14/15  1:50 PM  Result Value Ref Range   Color, Urine YELLOW YELLOW   APPearance HAZY  (A) CLEAR   Specific Gravity, Urine 1.025 1.005 - 1.030   pH 6.0 5.0 - 8.0   Glucose, UA NEGATIVE NEGATIVE mg/dL   Hgb urine dipstick TRACE (A) NEGATIVE   Bilirubin Urine NEGATIVE NEGATIVE   Ketones, ur NEGATIVE NEGATIVE mg/dL   Protein, ur NEGATIVE NEGATIVE mg/dL   Nitrite POSITIVE (A) NEGATIVE   Leukocytes, UA NEGATIVE NEGATIVE  Urine microscopic-add on     Status: Abnormal   Collection Time: 04/14/15  1:50 PM  Result Value Ref Range   Squamous Epithelial / LPF 0-5 (A) NONE SEEN   WBC, UA 0-5 0 - 5 WBC/hpf   RBC / HPF 0-5 0 - 5 RBC/hpf   Bacteria, UA MANY (A) NONE SEEN   Urine-Other AMORPHOUS URATES/PHOSPHATES   Pregnancy, urine POC     Status: Abnormal   Collection Time: 04/14/15  1:58 PM  Result Value Ref Range   Preg Test, Ur POSITIVE (A) NEGATIVE    Comment:        THE SENSITIVITY OF THIS METHODOLOGY IS >24 mIU/mL   Wet prep, genital     Status: Abnormal   Collection Time: 04/14/15  2:36 PM  Result Value Ref Range   Yeast Wet Prep HPF POC NONE SEEN NONE SEEN   Trich, Wet Prep  NONE SEEN NONE SEEN   Clue Cells Wet Prep HPF POC PRESENT (A) NONE SEEN   WBC, Wet Prep HPF POC MANY (A) NONE SEEN    Comment: BACTERIA- TOO NUMEROUS TO COUNT   Sperm NONE SEEN     Review of Systems  Constitutional: Negative for fever and chills.  Gastrointestinal: Positive for vomiting. Negative for heartburn, nausea, abdominal pain (Abdominal pain 0/10), diarrhea and constipation.  Genitourinary: Negative for dysuria, urgency and frequency.       Denies vaginal bleeding  Neurological: Negative for headaches.   Physical Exam   Blood pressure 122/79, pulse 87, temperature 98.3 F (36.8 C), temperature source Oral, resp. rate 18, height 5\' 6"  (1.676 m), last menstrual period 02/21/2015.  Physical Exam  Constitutional: She is oriented to person, place, and time. She appears well-developed and well-nourished. No distress.  HENT:  Head: Normocephalic.  Respiratory: Effort normal.  GI:  Soft.  Genitourinary:  Speculum exam: Vagina - Moderate amount of creamy, white discharge, mild odor Cervix - No contact bleeding Bimanual exam: Cervix closed, no CMT  Uterus non tender, slightly gravid  Adnexa non tender, no masses bilaterally GC/Chlam, wet prep done Chaperone present for exam.  Musculoskeletal: Normal range of motion.  Neurological: She is alert and oriented to person, place, and time.  Skin: Skin is warm. She is not diaphoretic.  Psychiatric: Her behavior is normal.    MAU Course  Procedures  None  MDM  Patient wanting [place] today to evaluate gestational age; patient is without pain or bleeding today.  Assessment and Plan   A:  1. UTI in pregnancy, antepartum, first trimester   2. BV (bacterial vaginosis)     P:  Discharge home in stable condition RX: Flagyl, Keflex Follow up if symptoms worsen Call Dr. Gaynell Face to schedule appointment or the [facility].  Urine culture pending    Duane Lope, NP 04/14/2015 4:13 PM

## 2015-04-15 LAB — GC/CHLAMYDIA PROBE AMP (~~LOC~~) NOT AT ARMC
CHLAMYDIA, DNA PROBE: NEGATIVE
Neisseria Gonorrhea: NEGATIVE

## 2015-04-16 LAB — CULTURE, OB URINE: Special Requests: NORMAL

## 2015-04-17 ENCOUNTER — Telehealth: Payer: Self-pay | Admitting: Obstetrics and Gynecology

## 2015-04-17 MED ORDER — CLINDAMYCIN HCL 300 MG PO CAPS: 300.0000 mg | ORAL_CAPSULE | Freq: Four times a day (QID) | ORAL | Status: DC

## 2015-04-17 NOTE — Telephone Encounter (Signed)
+  Staphlococcus in Urine. Advised her to stop keflex and start clindamycin.

## 2015-04-24 ENCOUNTER — Telehealth: Payer: Self-pay | Admitting: Physician Assistant

## 2015-04-24 NOTE — Telephone Encounter (Signed)
Pt called and left message.  She was seen 04/14/15 and given rx for keflex.  She got this medication and started using it.  She then received a call on 04/17/15 telling her to stop the keflex and begin a new med.  The pharmacy reports it costs $100.  Pt doesn't have this and requests advice.  She has not had symptoms the entire time of a UTI.  Pt may continue Keflex until gone.  Then, will have urine culture at her NOB visit.  If any symptoms arise, she can always return to MAU for assessment.

## 2015-05-15 LAB — OB RESULTS CONSOLE GC/CHLAMYDIA: Gonorrhea: NEGATIVE

## 2015-05-15 LAB — OB RESULTS CONSOLE RPR: RPR: NONREACTIVE

## 2015-05-15 LAB — OB RESULTS CONSOLE HIV ANTIBODY (ROUTINE TESTING): HIV: NONREACTIVE

## 2015-05-15 LAB — OB RESULTS CONSOLE HEPATITIS B SURFACE ANTIGEN: HEP B S AG: NEGATIVE

## 2015-05-15 LAB — OB RESULTS CONSOLE ANTIBODY SCREEN: Antibody Screen: NEGATIVE

## 2015-05-15 LAB — CYTOLOGY - PAP: PAP SMEAR: NEGATIVE

## 2015-05-15 LAB — OB RESULTS CONSOLE RUBELLA ANTIBODY, IGM: RUBELLA: IMMUNE

## 2015-10-09 ENCOUNTER — Encounter: Payer: Self-pay | Admitting: Obstetrics & Gynecology

## 2015-10-09 ENCOUNTER — Ambulatory Visit (INDEPENDENT_AMBULATORY_CARE_PROVIDER_SITE_OTHER): Payer: Medicaid Other | Admitting: Obstetrics & Gynecology

## 2015-10-09 VITALS — BP 115/70 | HR 89 | Wt 193.0 lb

## 2015-10-09 DIAGNOSIS — Z3483 Encounter for supervision of other normal pregnancy, third trimester: Secondary | ICD-10-CM

## 2015-10-09 DIAGNOSIS — Z3493 Encounter for supervision of normal pregnancy, unspecified, third trimester: Secondary | ICD-10-CM

## 2015-10-09 NOTE — Patient Instructions (Signed)
Return to clinic for any scheduled appointments or obstetric concerns, or go to MAU for evaluation  

## 2015-10-09 NOTE — Progress Notes (Signed)
Subjective:  Adrienne Turner is a 30 y.o. [phone] at 5933w6d being seen today for ongoing prenatal care.   She was a patient of Dr. Elsie Turner's practice. She is currently monitored for the following issues for this low-risk pregnancy and has Supervision of normal pregnancy in third trimester on her problem list.  Patient reports annoyance at the fact that she is now affiliated with another practice.  Contractions: Not present. Vag. Bleeding: None.  Movement: Present. Denies leaking of fluid.   The following portions of the patient's history were reviewed and updated as appropriate: allergies, current medications, past family history, past medical history, past social history, past surgical history and problem list. Problem list updated.  Objective:   Filed Vitals:   10/09/15 1502  BP: 115/70  Pulse: 89  Weight: 193 lb (87.544 kg)    Fetal Status: Fetal Heart Rate (bpm): 138 Fundal Height: 37 cm Movement: Present     General:  Alert, oriented and cooperative. Patient is in no acute distress.  Skin: Skin is warm and dry. No rash noted.   Cardiovascular: Normal heart rate noted  Respiratory: Normal respiratory effort, no problems with respiration noted  Abdomen: Soft, gravid, appropriate for gestational age. Pain/Pressure: Absent     Pelvic:  Cervical exam deferred        Extremities: Normal range of motion.  Edema: None  Mental Status: Normal mood and affect. Normal behavior. Normal judgment and thought content.   Urinalysis: Urine Protein: Negative Urine Glucose: Negative Negative GBS, GC/Chlam on 09/25/15  Assessment and Plan:  Pregnancy: [id number] at 6133w6d  Supervision of normal pregnancy in third trimester Patient was very upset that she will not just be seen by Dr. Clearance Turner. She was told that we do all the deliveries now and she was very upset and rude throughout the encounter.  Preterm labor symptoms and general obstetric precautions including but not limited to vaginal bleeding,  contractions, leaking of fluid and fetal movement were reviewed in detail with the patient. Please refer to After Visit Summary for other counseling recommendations.  Return in about 1 week (around 10/16/2015) for OB Visit (patient prefers to see Dr. Clearance Turner).   Tereso NewcomerUgonna A Erika Hussar, MD

## 2015-10-16 ENCOUNTER — Encounter: Payer: Self-pay | Admitting: *Deleted

## 2015-10-17 ENCOUNTER — Ambulatory Visit (INDEPENDENT_AMBULATORY_CARE_PROVIDER_SITE_OTHER): Payer: Medicaid Other | Admitting: Obstetrics

## 2015-10-17 VITALS — BP 123/73 | HR 94 | Temp 97.8°F | Wt 194.0 lb

## 2015-10-17 DIAGNOSIS — Z1389 Encounter for screening for other disorder: Secondary | ICD-10-CM | POA: Diagnosis not present

## 2015-10-17 DIAGNOSIS — Z331 Pregnant state, incidental: Secondary | ICD-10-CM

## 2015-10-17 DIAGNOSIS — Z3483 Encounter for supervision of other normal pregnancy, third trimester: Secondary | ICD-10-CM

## 2015-10-17 DIAGNOSIS — K219 Gastro-esophageal reflux disease without esophagitis: Secondary | ICD-10-CM

## 2015-10-17 LAB — POCT URINALYSIS DIPSTICK
Bilirubin, UA: NEGATIVE
Blood, UA: 250
Glucose, UA: NEGATIVE
Ketones, UA: NEGATIVE
NITRITE UA: NEGATIVE
SPEC GRAV UA: 1.01
UROBILINOGEN UA: NEGATIVE
pH, UA: 7

## 2015-10-17 MED ORDER — OMEPRAZOLE 20 MG PO CPDR: 20.0000 mg | DELAYED_RELEASE_CAPSULE | Freq: Two times a day (BID) | ORAL | Status: DC

## 2015-10-18 ENCOUNTER — Encounter: Payer: Self-pay | Admitting: Obstetrics

## 2015-10-18 NOTE — Progress Notes (Signed)
Subjective:    Adrienne Turner is a 30 y.o. female being seen today for her obstetrical visit. She is at [redacted]w[redacted]d gestation. Patient reports no complaints. Fetal movement: normal.  Problem List Items Addressed This Visit    None    Visit Diagnoses    Encounter for supervision of other normal pregnancy in third trimester    -  Primary    Relevant Orders    POCT urinalysis dipstick (Completed)    GERD without esophagitis        Relevant Medications    omeprazole (PRILOSEC) 20 MG capsule      Patient Active Problem List   Diagnosis Date Noted  . Supervision of normal pregnancy in third trimester 10/09/2015    Objective:    BP 123/73 mmHg  Pulse 94  Temp(Src) 97.8 F (36.6 C)  Wt 194 lb (87.998 kg)  LMP 02/21/2015 (Approximate) FHT: 140 BPM  Uterine Size: size equals dates       Assessment:    Pregnancy @ [redacted]w[redacted]d weeks   Plan:   Plans for delivery: Vaginal anticipated; labs reviewed; problem list updated Counseling: Consent signed. Infant feeding: plans to breastfeed. Cigarette smoking: never smoked. L&D discussion: symptoms of labor, discussed when to call, discussed what number to call, anesthetic/analgesic options reviewed and delivering clinician:  plans no preference. Postpartum supports and preparation: circumcision discussed and contraception plans discussed.  Follow up in 1 Week.

## 2015-10-22 ENCOUNTER — Ambulatory Visit (INDEPENDENT_AMBULATORY_CARE_PROVIDER_SITE_OTHER): Payer: Medicaid Other | Admitting: Certified Nurse Midwife

## 2015-10-22 VITALS — BP 116/74 | HR 90 | Temp 99.0°F | Wt 192.0 lb

## 2015-10-22 DIAGNOSIS — Z331 Pregnant state, incidental: Secondary | ICD-10-CM | POA: Diagnosis not present

## 2015-10-22 DIAGNOSIS — Z3493 Encounter for supervision of normal pregnancy, unspecified, third trimester: Secondary | ICD-10-CM | POA: Diagnosis not present

## 2015-10-22 DIAGNOSIS — Z1389 Encounter for screening for other disorder: Secondary | ICD-10-CM

## 2015-10-22 LAB — POCT URINALYSIS DIPSTICK
BILIRUBIN UA: NEGATIVE
Blood, UA: POSITIVE
Glucose, UA: NEGATIVE
Ketones, UA: POSITIVE
Nitrite, UA: NEGATIVE
Protein, UA: POSITIVE
Spec Grav, UA: 1.015
Urobilinogen, UA: 1
pH, UA: 7.5

## 2015-10-22 NOTE — Progress Notes (Signed)
Patient states that she has no concerns today. 

## 2015-10-22 NOTE — Progress Notes (Signed)
Subjective:    Adrienne Turner is a 30 y.o. female being seen today for her obstetrical visit. She is at [redacted]w[redacted]d gestation. Patient reports no complaints. Fetal movement: normal.  Problem List Items Addressed This Visit    None    Visit Diagnoses    Prenatal care in third trimester    -  Primary   Relevant Orders   POCT Urinalysis Dipstick (Completed)     Patient Active Problem List   Diagnosis Date Noted  . Supervision of normal pregnancy in third trimester 10/09/2015    Objective:    BP 116/74   Pulse 90   Temp 99 F (37.2 C)   Wt 192 lb (87.1 kg)   LMP 02/21/2015 (Approximate) Comment: periods are irregular  BMI 30.99 kg/m  FHT: 135 BPM  Uterine Size: size equals dates  Presentations: cephalic  Pelvic Exam:              Dilation: 2cm       Effacement: 50%             Station:  -3    Consistency: medium            Position: middle     Assessment:    Pregnancy @ [redacted]w[redacted]d weeks   Plan:   Plans for delivery: Vaginal anticipated; labs reviewed; problem list updated Counseling: Consent signed. Infant feeding: plans to breastfeed. Cigarette smoking: never smoked. L&D discussion: symptoms of labor, discussed when to call, discussed what number to call, anesthetic/analgesic options reviewed and delivering clinician:  plans Certified Nurse-Midwife. Postpartum supports and preparation: circumcision discussed and contraception plans discussed.  Follow up in 1 Week.

## 2015-10-24 ENCOUNTER — Inpatient Hospital Stay (HOSPITAL_COMMUNITY): Payer: Medicaid Other | Admitting: Anesthesiology

## 2015-10-24 ENCOUNTER — Inpatient Hospital Stay (HOSPITAL_COMMUNITY)
Admission: AD | Admit: 2015-10-24 | Discharge: 2015-10-26 | DRG: 775 | Disposition: A | Discharge status: Home / Self Care | Payer: Medicaid Other | Source: Ambulatory Visit | Attending: Obstetrics & Gynecology | Admitting: Obstetrics & Gynecology

## 2015-10-24 ENCOUNTER — Encounter (HOSPITAL_COMMUNITY): Payer: Self-pay

## 2015-10-24 DIAGNOSIS — O99824 Streptococcus B carrier state complicating childbirth: Secondary | ICD-10-CM | POA: Diagnosis present

## 2015-10-24 DIAGNOSIS — Z3A39 39 weeks gestation of pregnancy: Secondary | ICD-10-CM | POA: Diagnosis not present

## 2015-10-24 DIAGNOSIS — Z3493 Encounter for supervision of normal pregnancy, unspecified, third trimester: Secondary | ICD-10-CM

## 2015-10-24 DIAGNOSIS — IMO0001 Reserved for inherently not codable concepts without codable children: Secondary | ICD-10-CM

## 2015-10-24 DIAGNOSIS — Z3483 Encounter for supervision of other normal pregnancy, third trimester: Secondary | ICD-10-CM | POA: Diagnosis present

## 2015-10-24 LAB — GROUP B STREP BY PCR: GROUP B STREP BY PCR: POSITIVE — AB

## 2015-10-24 LAB — CBC
HCT: 30.5 % — ABNORMAL LOW (ref 36.0–46.0)
Hemoglobin: 10.1 g/dL — ABNORMAL LOW (ref 12.0–15.0)
MCH: 29.9 pg (ref 26.0–34.0)
MCHC: 33.1 g/dL (ref 30.0–36.0)
MCV: 90.2 fL (ref 78.0–100.0)
PLATELETS: 393 10*3/uL (ref 150–400)
RBC: 3.38 MIL/uL — ABNORMAL LOW (ref 3.87–5.11)
RDW: 15.4 % (ref 11.5–15.5)
WBC: 10.1 10*3/uL (ref 4.0–10.5)

## 2015-10-24 LAB — TYPE AND SCREEN
ABO/RH(D): A POS
Antibody Screen: NEGATIVE

## 2015-10-24 LAB — RPR: RPR Ser Ql: NONREACTIVE

## 2015-10-24 MED ORDER — PHENYLEPHRINE 40 MCG/ML (10ML) SYRINGE FOR IV PUSH (FOR BLOOD PRESSURE SUPPORT)
80.0000 ug | PREFILLED_SYRINGE | INTRAVENOUS | Status: DC | PRN
  Administered 2015-10-24: 80 ug via INTRAVENOUS
  Filled 2015-10-24: qty 5

## 2015-10-24 MED ORDER — OXYTOCIN BOLUS FROM INFUSION: 500.0000 mL | Freq: Once | INTRAVENOUS | Status: DC

## 2015-10-24 MED ORDER — EPHEDRINE 5 MG/ML INJ
10.0000 mg | INTRAVENOUS | Status: DC | PRN
  Filled 2015-10-24: qty 4

## 2015-10-24 MED ORDER — LACTATED RINGERS IV SOLN
INTRAVENOUS | Status: DC
  Administered 2015-10-24: 125 mL via INTRAVENOUS

## 2015-10-24 MED ORDER — FENTANYL CITRATE (PF) 100 MCG/2ML IJ SOLN: 50.0000 ug | INTRAMUSCULAR | Status: DC | PRN

## 2015-10-24 MED ORDER — PENICILLIN G POTASSIUM 5000000 UNITS IJ SOLR
2.5000 10*6.[IU] | INTRAMUSCULAR | Status: DC
  Administered 2015-10-24: 2.5 10*6.[IU] via INTRAVENOUS
  Filled 2015-10-24 (×6): qty 2.5

## 2015-10-24 MED ORDER — FLEET ENEMA 7-19 GM/118ML RE ENEM: 1.0000 | ENEMA | RECTAL | Status: DC | PRN

## 2015-10-24 MED ORDER — ACETAMINOPHEN 325 MG PO TABS: 650.0000 mg | ORAL_TABLET | ORAL | Status: DC | PRN

## 2015-10-24 MED ORDER — SOD CITRATE-CITRIC ACID 500-334 MG/5ML PO SOLN: 30.0000 mL | ORAL | Status: DC | PRN

## 2015-10-24 MED ORDER — LIDOCAINE HCL (PF) 1 % IJ SOLN
30.0000 mL | INTRAMUSCULAR | Status: DC | PRN
  Filled 2015-10-24: qty 30

## 2015-10-24 MED ORDER — OXYTOCIN 40 UNITS IN LACTATED RINGERS INFUSION - SIMPLE MED: 2.5000 [IU]/h | INTRAVENOUS | Status: DC

## 2015-10-24 MED ORDER — DIPHENHYDRAMINE HCL 50 MG/ML IJ SOLN: 12.5000 mg | INTRAMUSCULAR | Status: DC | PRN

## 2015-10-24 MED ORDER — PENICILLIN G POTASSIUM 5000000 UNITS IJ SOLR
5.0000 10*6.[IU] | Freq: Once | INTRAMUSCULAR | Status: AC
  Administered 2015-10-24: 5 10*6.[IU] via INTRAVENOUS
  Filled 2015-10-24: qty 5

## 2015-10-24 MED ORDER — LACTATED RINGERS IV SOLN: INTRAVENOUS | Status: DC

## 2015-10-24 MED ORDER — FENTANYL 2.5 MCG/ML BUPIVACAINE 1/10 % EPIDURAL INFUSION (WH - ANES)
14.0000 mL/h | INTRAMUSCULAR | Status: DC | PRN
  Administered 2015-10-24: 14 mL/h via EPIDURAL
  Filled 2015-10-24: qty 125

## 2015-10-24 MED ORDER — PHENYLEPHRINE 40 MCG/ML (10ML) SYRINGE FOR IV PUSH (FOR BLOOD PRESSURE SUPPORT)
80.0000 ug | PREFILLED_SYRINGE | INTRAVENOUS | Status: DC | PRN
  Filled 2015-10-24: qty 10
  Filled 2015-10-24: qty 5

## 2015-10-24 MED ORDER — LIDOCAINE HCL (PF) 1 % IJ SOLN
INTRAMUSCULAR | Status: DC | PRN
  Administered 2015-10-24: 4 mL
  Administered 2015-10-24: 6 mL via EPIDURAL

## 2015-10-24 MED ORDER — ONDANSETRON HCL 4 MG/2ML IJ SOLN: 4.0000 mg | Freq: Four times a day (QID) | INTRAMUSCULAR | Status: DC | PRN

## 2015-10-24 MED ORDER — OXYCODONE-ACETAMINOPHEN 5-325 MG PO TABS
2.0000 | ORAL_TABLET | ORAL | Status: DC | PRN
  Administered 2015-10-25 (×2): 2 via ORAL
  Filled 2015-10-24 (×2): qty 2

## 2015-10-24 MED ORDER — LACTATED RINGERS IV SOLN: 500.0000 mL | INTRAVENOUS | Status: DC | PRN

## 2015-10-24 MED ORDER — OXYTOCIN 40 UNITS IN LACTATED RINGERS INFUSION - SIMPLE MED
2.5000 [IU]/h | INTRAVENOUS | Status: DC
  Filled 2015-10-24: qty 1000

## 2015-10-24 MED ORDER — LACTATED RINGERS IV SOLN
500.0000 mL | Freq: Once | INTRAVENOUS | Status: AC
  Administered 2015-10-24: 500 mL via INTRAVENOUS

## 2015-10-24 MED ORDER — OXYCODONE-ACETAMINOPHEN 5-325 MG PO TABS
1.0000 | ORAL_TABLET | ORAL | Status: DC | PRN
Start: 2015-10-24 — End: 2015-10-25

## 2015-10-24 NOTE — Anesthesia Pain Management Evaluation Note (Signed)
  CRNA Pain Management Visit Note  Patient: Adrienne Turner, 30 y.o., female  "Hello I am a member of the anesthesia team at [facility]. We have an anesthesia team available at all times to provide care throughout the hospital, including epidural management and anesthesia for C-section. I don't know your plan for the delivery whether it a natural birth, water birth, IV sedation, nitrous supplementation, doula or epidural, but we want to meet your pain goals."   1.Was your pain managed to your expectations on prior hospitalizations?   yes  2.What is your expectation for pain management during this hospitalization?  Epidural  3.How can we help you reach that goal?epidural after return of blood work  Record the patient's initial score and the patient's pain goal.   Pain: 9  Pain Goal: 5 [facility] wants you to be able to say your pain was always managed very well.  [facility] 10/24/2015

## 2015-10-24 NOTE — Progress Notes (Signed)
Adrienne Turner is a 30 y.o. [id number] at [redacted]w[redacted]d by LMP admitted for active labor.  Subjective: Adrienne Turner is resting comfortably with the epidural and has no concerns at this time.  Objective: BP 106/61   Pulse 89   Temp 98.5 F (36.9 C) (Oral)   Resp 16   Ht 5\' 6"  (1.676 m)   Wt 192 lb (87.1 kg)   LMP 02/21/2015 (Approximate) Comment: periods are irregular  SpO2 99%   BMI 30.99 kg/m  No intake/output data recorded. No intake/output data recorded.  FHT: Fetal Heart Rate A  Mode External filed at 10/24/2015 1101  Baseline Rate (A) 130 bpm filed at 10/24/2015 1706  Variability <5 BPM filed at 10/24/2015 1706  Accelerations 10 x 10 filed at 10/24/2015 1706  Decelerations Variable filed at 10/24/2015     UC:   regular, moderate ctx every 5-7 minutes, lasting 50-60 sec SVE:   Dilation: 6 Effacement (%): 100 Station: 0 Exam by:: Anyanwu  Labs: Lab Results  Component Value Date   WBC 10.1 10/24/2015   HGB 10.1 (L) 10/24/2015   HCT 30.5 (L) 10/24/2015   MCV 90.2 10/24/2015   PLT 393 10/24/2015    Assessment / Plan: Protracted labor, AROM @ 1525, next check in 20 min  Fetal Wellbeing:  Category II Pain Control:  Epidural I/D:  GBS pos, PCN-G Anticipated MOD:  NSVD   Andres Ege, MD, PGY-1, MPH 10/24/2015, 4:55 PM

## 2015-10-24 NOTE — Anesthesia Procedure Notes (Addendum)

## 2015-10-24 NOTE — MAU Note (Signed)
Started having regular contractions since 0300 am, denies vaginal bleeding,no LOF, denies problems during the pregnancy.

## 2015-10-24 NOTE — Anesthesia Preprocedure Evaluation (Signed)

## 2015-10-24 NOTE — H&P (Signed)
LABOR AND DELIVERY ADMISSION HISTORY AND PHYSICAL NOTE  Adrienne Turner is a 30 y.o. female [phone] with IUP at [redacted]w[redacted]d by 16 wk [place] presenting for SOL. Presented to MAU at 7:30 am (3.5 hours ago) with contractions 3-4 min apart. On exam was found to be 4/70/-2. Was admitted to L&D.   She reports positive fetal movement. She denies leakage of fluid or vaginal bleeding. She received prenatal care from Dr. Gaynell Face until [redacted]w[redacted]d and then transferred to [facility] for [facility].  Prenatal History/Complications:  Past Medical History: Past Medical History:  Diagnosis Date  . Medical history non-contributory     Past Surgical History: Past Surgical History:  Procedure Laterality Date  . NO PAST SURGERIES      Obstetrical History: OB History    Gravida Para Term Preterm AB Living   0 2 2   SAB TAB Ectopic Multiple Live Births   0 2 0 1        Social History: Social History   Social History  . Marital status: Single    Spouse name: N/A  . Number of children: N/A  . Years of education: N/A   Social History Main Topics  . Smoking status: Never Smoker  . Smokeless tobacco: Never Used  . Alcohol use No  . Drug use: No  . Sexual activity: Yes   Other Topics Concern  . None   Social History Narrative  . None    Family History: No family history on file.  Allergies: No Known Allergies  Prescriptions Prior to Admission  Medication Sig Dispense Refill Last Dose  . omeprazole (PRILOSEC) 20 MG capsule Take 1 capsule (20 mg total) by mouth 2 (two) times daily before a meal. 60 capsule 5 Taking    Review of Systems   All systems reviewed and negative except as stated in HPI  Blood pressure 96/60, pulse 88, temperature 98.5 F (36.9 C), resp. rate 16, height  (1.676 m), weight 192 lb (87.1 kg), last menstrual period 02/21/2015, SpO2 99 %.   General appearance: alert, cooperative and no distress Lungs: clear to auscultation bilaterally Heart: regular rate and  rhythm Abdomen: soft, non-tender;  Extremities: No calf swelling or tenderness  Presentation: cephalic  Fetal monitoring:   Fetal Heart Rate A  Mode External filed at 10/24/2015 0932  Baseline Rate (A) 125 bpm filed at 10/24/2015 0932  Variability 6-25 BPM filed at 10/24/2015 0932  Accelerations 15 x 15 filed at 10/24/2015 0932  Decelerations None filed at 10/24/2015 0932   Uterine activity: moderate contractions, 2-3 min apart, 50-70 seconds duration   Dilation: 5 Effacement (%): 100 Station: -1 Exam by:: lee   Prenatal labs: ABO, Rh: --/--/A POS (07/28 0800) Antibody: NEG (07/28 0800) Rubella: Immune RPR: Nonreactive (02/16 0000)  HBsAg: Negative (02/16 0000)  HIV: Non-reactive (02/16 0000)  GBS:  GBS positive 1 hr Glucola: normal per patient  Genetic screening:  Declined  Anatomy US: normal, girl   Prenatal Transfer Tool  Maternal Diabetes: No Genetic Screening: Declined Maternal Ultrasounds/Referrals: Normal Fetal Ultrasounds or other Referrals:  None Maternal Substance Abuse:  No Significant Maternal Medications:   No current facility-administered medications on file prior to encounter.    Current Outpatient Prescriptions on File Prior to Encounter  Medication Sig Dispense Refill  . omeprazole (PRILOSEC) 20 MG capsule Take 1 capsule (20 mg total) by mouth 2 (two) times daily before a meal. 60 capsule 5   Significant Maternal Lab Results:  GBS positive  Results for orders placed or performed during the hospital encounter of 10/24/15 (from the past 24 hour(s))  CBC   Collection Time: 10/24/15  8:00 AM  Result Value Ref Range   WBC 10.1 4.0 - 10.5 K/uL   RBC 3.38 (L) 3.87 - 5.11 MIL/uL   Hemoglobin 10.1 (L) 12.0 - 15.0 g/dL   HCT 20.9 (L) 47.0 - 96.2 %   MCV 90.2 78.0 - 100.0 fL   MCH 29.9 26.0 - 34.0 pg   MCHC 33.1 30.0 - 36.0 g/dL   RDW 83.6 62.9 - 47.6 %   Platelets 393 150 - 400 K/uL  Type and screen [facility]   Collection  Time: 10/24/15  8:00 AM  Result Value Ref Range   ABO/RH(D) A POS    Antibody Screen NEG    Sample Expiration 10/27/2015   Group B strep by PCR   Collection Time: 10/24/15  8:40 AM  Result Value Ref Range   Group B strep by PCR POSITIVE (A) NEGATIVE    Patient Active Problem List   Diagnosis Date Noted  . Active labor 10/24/2015  . Supervision of normal pregnancy in third trimester 10/09/2015    Assessment: Adrienne Turner is a 30 y.o. [id number] at [redacted]w[redacted]d here for expectant management of SOL.   #Labor: membranes intact, expectant management of SVD #Pain: epidural  #FWB: Cat I #ID:  GBS positive, penicillin running  #MOF: breast to bottle #MOC: nexplanon  #Circ:  Girl, NA  Andres Ege, MD, PGY-1, MPH 10/24/2015, 10:12 AM  CNM attestation:  I have seen and examined this patient; I agree with above documentation in the resident's note.   Adrienne Turner is a 30 y.o. [phone] here for SOL  PE: BP (!) 120/54   Pulse 88   Temp 98.6 F (37 C) (Oral)   Resp 16   Ht 5\' 6"  (1.676 m)   Wt 87.1 kg (192 lb)   LMP 02/21/2015 (Approximate)   SpO2 99%   Breastfeeding? Unknown   BMI 30.99 kg/m   Resp: normal effort, no distress Abd: gravid  ROS, labs, PMH reviewed  Plan: -Admit to [employer] [employer] -Expectant management GBS PCR pending (notation of neg GBS from 6/29 noted in prenatal record, but actual lab report not seen- will continue with PCR as it is already being run) -Anticipate SVD  [name] 10/24/2015, 1100

## 2015-10-24 NOTE — MAU Note (Signed)
Notified Adrienne Turner CNM, patient labor since 0300 this morning, G5P2  Cervix 4/70/-2 intact, contractions every 3 to 4, CNM to place admit orders.

## 2015-10-24 NOTE — Progress Notes (Signed)
Assumed care of mom.  Family at bedside.

## 2015-10-24 NOTE — Progress Notes (Signed)
Utilization review completed.  L. J. Ynez Eugenio RN, BSN, CM 

## 2015-10-24 NOTE — MAU Note (Signed)
Started contracting this morning.  Was 2 on Wed.  "membranes swept", has  Had a small amt of bleeding since.

## 2015-10-25 MED ORDER — SENNOSIDES-DOCUSATE SODIUM 8.6-50 MG PO TABS
2.0000 | ORAL_TABLET | ORAL | Status: DC
  Administered 2015-10-26: 2 via ORAL
  Filled 2015-10-25: qty 2

## 2015-10-25 MED ORDER — ONDANSETRON HCL 4 MG/2ML IJ SOLN: 4.0000 mg | INTRAMUSCULAR | Status: DC | PRN

## 2015-10-25 MED ORDER — SIMETHICONE 80 MG PO CHEW: 80.0000 mg | CHEWABLE_TABLET | ORAL | Status: DC | PRN

## 2015-10-25 MED ORDER — ONDANSETRON HCL 4 MG PO TABS: 4.0000 mg | ORAL_TABLET | ORAL | Status: DC | PRN

## 2015-10-25 MED ORDER — BENZOCAINE-MENTHOL 20-0.5 % EX AERO: 1.0000 "application " | INHALATION_SPRAY | CUTANEOUS | Status: DC | PRN

## 2015-10-25 MED ORDER — COCONUT OIL OIL: 1.0000 "application " | TOPICAL_OIL | Status: DC | PRN

## 2015-10-25 MED ORDER — ACETAMINOPHEN 325 MG PO TABS: 650.0000 mg | ORAL_TABLET | ORAL | Status: DC | PRN

## 2015-10-25 MED ORDER — DIBUCAINE 1 % RE OINT: 1.0000 "application " | TOPICAL_OINTMENT | RECTAL | Status: DC | PRN

## 2015-10-25 MED ORDER — TETANUS-DIPHTH-ACELL PERTUSSIS 5-2.5-18.5 LF-MCG/0.5 IM SUSP: 0.5000 mL | Freq: Once | INTRAMUSCULAR | Status: DC

## 2015-10-25 MED ORDER — IBUPROFEN 600 MG PO TABS
600.0000 mg | ORAL_TABLET | Freq: Four times a day (QID) | ORAL | Status: DC
  Administered 2015-10-25 – 2015-10-26 (×5): 600 mg via ORAL
  Filled 2015-10-25 (×5): qty 1

## 2015-10-25 MED ORDER — PRENATAL MULTIVITAMIN CH
1.0000 | ORAL_TABLET | Freq: Every day | ORAL | Status: DC
  Administered 2015-10-25 – 2015-10-26 (×2): 1 via ORAL
  Filled 2015-10-25 (×2): qty 1

## 2015-10-25 MED ORDER — DIPHENHYDRAMINE HCL 25 MG PO CAPS: 25.0000 mg | ORAL_CAPSULE | Freq: Four times a day (QID) | ORAL | Status: DC | PRN

## 2015-10-25 MED ORDER — WITCH HAZEL-GLYCERIN EX PADS: 1.0000 "application " | MEDICATED_PAD | CUTANEOUS | Status: DC | PRN

## 2015-10-25 MED ORDER — ZOLPIDEM TARTRATE 5 MG PO TABS: 5.0000 mg | ORAL_TABLET | Freq: Every evening | ORAL | Status: DC | PRN

## 2015-10-25 NOTE — Progress Notes (Signed)
Patient ID: Adrienne Turner, female   DOB: 15-Mar-1986, 30 y.o.   MRN: [id number]  POSTPARTUM PROGRESS NOTE  Post Partum Day #1 Subjective:  Adrienne Turner is a 30 y.o. [id number] [redacted]w[redacted]d s/p SVD.  No acute events overnight.  Pt denies problems with ambulating, voiding or po intake.  She denies nausea or vomiting.  Pain is well controlled.  She has had flatus. She has not had bowel movement.  Lochia Minimal.   Objective: Blood pressure 113/63, pulse 88, temperature 98.3 F (36.8 C), temperature source Oral, resp. rate 18, height 5\' 6"  (1.676 m), weight 192 lb (87.1 kg), last menstrual period 02/21/2015, SpO2 99 %, unknown if currently breastfeeding.  Physical Exam:  General: alert, cooperative and no distress Lochia:normal flow Chest: CTAB Heart: RRR no m/r/g Abdomen: +BS, soft, nontender,  Uterine Fundus: firm, below umbilicus DVT Evaluation: No calf swelling or tenderness Extremities: No edema   Recent Labs  10/24/15 0800  HGB 10.1*  HCT 30.5*    Assessment/Plan:  ASSESSMENT: Adrienne Turner is a 30 y.o. [id number] [redacted]w[redacted]d s/p SVD.  Plan for discharge tomorrow  Encouraged ambulation. Bottlefeeding. Nexplanon outpatient for MOC.   LOS: 1 day   Jen Mow, DO 10/25/2015, 1:06 PM

## 2015-10-25 NOTE — Progress Notes (Signed)
Post Partum Day 1  Subjective:  Adrienne Turner is a 30 y.o. I4P8099 [redacted]w[redacted]d s/p SVD.  No acute events overnight.  Pt denies problems with ambulating, voiding or po intake.  She denies nausea or vomiting.  Pain is well controlled.  She has not had flatus. She has not had bowel movement.  Lochia Minimal.  Plan for birth control is Nexplanon.  Method of Feeding: Bottle  Objective: BP 113/63 (BP Location: Right Arm)   Pulse 88   Temp 98.3 F (36.8 C) (Oral)   Resp 18   Ht 5\' 6"  (1.676 m)   Wt 87.1 kg (192 lb)   LMP 02/21/2015 (Approximate)   SpO2 99%   Breastfeeding? Unknown   BMI 30.99 kg/m   Physical Exam:  General: alert, cooperative and no distress Lochia:normal flow Chest: CTAB Heart: RRR no m/r/g Abdomen: soft, nontender, fundus firm at/below umbilicus DVT Evaluation: No evidence of DVT seen on physical exam. Extremities: no edema   Recent Labs  10/24/15 0800  HGB 10.1*  HCT 30.5*    Assessment/Plan:  ASSESSMENT: Adrienne Turner is a 30 y.o. [id number] [redacted]w[redacted]d ppd #1 s/p NSVD doing well.   Stable overnight Plan for discharge tomorrow   LOS: 1 day   Bayard Hugger 10/25/2015, 7:46 AM   OB FELLOW MEDICAL STUDENT NOTE ATTESTATION  I have seen and examined this patient. Note this is a Psychologist, occupational note and as such does not necessarily reflect the patient's plan of care. Please see progress note for this date of service.    Jen Mow, DO 10/26/2015, 4:21 AM

## 2015-10-25 NOTE — Anesthesia Postprocedure Evaluation (Signed)
Anesthesia Post Note  Patient: Adrienne Turner  Procedure(s) Performed: * No procedures listed *  Patient location during evaluation: Mother Baby Anesthesia Type: Epidural Level of consciousness: awake and alert, oriented and patient cooperative Pain management: pain level controlled Vital Signs Assessment: post-procedure vital signs reviewed and stable Respiratory status: spontaneous breathing Cardiovascular status: stable Postop Assessment: no headache, epidural receding, patient able to bend at knees and no signs of nausea or vomiting Anesthetic complications: no Comments: Denies pain.     Last Vitals:  Vitals:   10/24/15 2050 10/25/15 0050  BP: 112/64 113/63  Pulse: 85 88  Resp: 20 18  Temp: 36.6 C 36.8 C    Last Pain:  Vitals:   10/25/15 0050  TempSrc: Oral  PainSc:    Pain Goal: Patients Stated Pain Goal: 3 (10/24/15 0730)               Merrilyn Puma

## 2015-10-26 MED ORDER — SENNOSIDES-DOCUSATE SODIUM 8.6-50 MG PO TABS: 1.0000 | ORAL_TABLET | ORAL | 0 refills | Status: DC

## 2015-10-26 MED ORDER — ACETAMINOPHEN 325 MG PO TABS: 650.0000 mg | ORAL_TABLET | ORAL | 1 refills | Status: DC | PRN

## 2015-10-26 MED ORDER — IBUPROFEN 600 MG PO TABS: 600.0000 mg | ORAL_TABLET | Freq: Four times a day (QID) | ORAL | 0 refills | Status: DC

## 2015-10-26 NOTE — Discharge Instructions (Signed)
Before [facility] Before your baby arrives it is important to:  Have all of the supplies that you will need to care for your baby.  Know where to go if there is an emergency.  Discuss the baby's arrival with other family members. WHAT SUPPLIES WILL I NEED? It is recommended that you have the following supplies: Large Items  Crib.  Crib mattress.  Rear-facing infant car seat. If possible, have a trained professional check to make sure that it is installed correctly. Feeding  6-8 bottles that are 4-5 oz in size.  6-8 nipples.  Bottle brush.  Sterilizer, or a large pan or kettle with a lid.  A way to boil and cool water.  If you will be breastfeeding:  Breast pump.  Nipple cream.  Nursing bra.  Breast pads.  Breast shields.  If you will be formula feeding:  Formula.  Measuring cups.  Measuring spoons. Bathing  Mild baby soap and baby shampoo.  Petroleum jelly.  Soft cloth towel and washcloth.  Hooded towel.  Cotton balls.  Bath basin. Other Supplies  Rectal thermometer.  Bulb syringe.  Baby wipes or washcloths for diaper changes.  Diaper bag.  Changing pad.  Clothing, including one-piece outfits and pajamas.  Baby nail clippers.  Receiving blankets.  Mattress pad and sheets for the crib.  Night-light for the baby's room.  Baby monitor.  2 or 3 pacifiers.  Either 24-36 cloth diapers and waterproof diaper covers or a box of disposable diapers. You may need to use as many as 10-12 diapers per day. HOW DO I PREPARE FOR AN EMERGENCY? Prepare for an emergency by:  Knowing how to get to the nearest hospital.  Listing the phone numbers of your baby's health care providers near your home phone and in your cell phone. HOW DO I PREPARE MY FAMILY?  Decide how to handle visitors.  If you have other children:  Talk with them about the baby coming home. Ask them how they feel about it.  Read a book together about being a new big  brother or sister.  Find ways to let them help you prepare for the new baby.  Have someone ready to care for them while you are in the hospital.   This information is not intended to replace advice given to you by your health care provider. Make sure you discuss any questions you have with your health care provider.   Document Released: 02/26/2008 Document Revised: 07/30/2014 Document Reviewed: 02/20/2014 Elsevier Interactive Patient Education 2016 [employer] Inc[employer]   Vaginal Delivery, Care After Refer to this sheet in the next few weeks. These discharge instructions provide you with information on caring for yourself after delivery. Your health care provider may also give you specific instructions. Your treatment has been planned according to the most current medical practices available, but problems sometimes occur. Call your health care provider if you have any problems or questions after you go home. HOME CARE INSTRUCTIONS  Take over-the-counter or prescription medicines only as directed by your health care provider or pharmacist.  Do not drink alcohol, especially if you are breastfeeding or taking medicine to relieve pain.  Do not chew or smoke tobacco.  Do not use illegal drugs.  Continue to use good perineal care. Good perineal care includes:  Wiping your perineum from front to back.  Keeping your perineum clean.  Do not use tampons or douche until your health care provider says it is okay.  Shower, wash your hair, and take  tub baths as directed by your health care provider.  Wear a well-fitting bra that provides breast support.  Eat healthy foods.  Drink enough fluids to keep your urine clear or pale yellow.  Eat high-fiber foods such as whole grain cereals and breads, brown rice, beans, and fresh fruits and vegetables every day. These foods may help prevent or relieve constipation.  Follow your health care provider's recommendations regarding resumption of activities  such as climbing stairs, driving, lifting, exercising, or traveling.  Talk to your health care provider about resuming sexual activities. Resumption of sexual activities is dependent upon your risk of infection, your rate of healing, and your comfort and desire to resume sexual activity.  Try to have someone help you with your household activities and your newborn for at least a few days after you leave the hospital.  Rest as much as possible. Try to rest or take a nap when your newborn is sleeping.  Increase your activities gradually.  Keep all of your scheduled postpartum appointments. It is very important to keep your scheduled follow-up appointments. At these appointments, your health care provider will be checking to make sure that you are healing physically and emotionally. SEEK MEDICAL CARE IF:   You are passing large clots from your vagina. Save any clots to show your health care provider.  You have a foul smelling discharge from your vagina.  You have trouble urinating.  You are urinating frequently.  You have pain when you urinate.  You have a change in your bowel movements.  You have increasing redness, pain, or swelling near your vaginal incision (episiotomy) or vaginal tear.  You have pus draining from your episiotomy or vaginal tear.  Your episiotomy or vaginal tear is separating.  You have painful, hard, or reddened breasts.  You have a severe headache.  You have blurred vision or see spots.  You feel sad or depressed.  You have thoughts of hurting yourself or your newborn.  You have questions about your care, the care of your newborn, or medicines.  You are dizzy or light-headed.  You have a rash.  You have nausea or vomiting.  You were breastfeeding and have not had a menstrual period within 12 weeks after you stopped breastfeeding.  You are not breastfeeding and have not had a menstrual period by the 12th week after delivery.  You have a  fever. SEEK IMMEDIATE MEDICAL CARE IF:   You have persistent pain.  You have chest pain.  You have shortness of breath.  You faint.  You have leg pain.  You have stomach pain.  Your vaginal bleeding saturates two or more sanitary pads in 1 hour.   This information is not intended to replace advice given to you by your health care provider. Make sure you discuss any questions you have with your health care provider.   Document Released: 03/12/2000 Document Revised: 12/04/2014 Document Reviewed: 11/10/2011 Elsevier Interactive Patient Education [employer]

## 2015-10-29 ENCOUNTER — Encounter: Payer: Medicaid Other | Admitting: Obstetrics

## 2015-11-04 NOTE — Discharge Summary (Signed)
OB Discharge Summary     Patient Name: Adrienne IronJessica C Baer DOB: 06/15/1985 MRN: [id number]  Date of admission: 10/24/2015 Delivering MD: Cam HaiSHAW, KIMBERLY D   Date of discharge: 11/04/2015  Admitting diagnosis: 39WKS,CTX Intrauterine pregnancy: [id number]     Secondary diagnosis:  Active Problems:   Active labor   Normal spontaneous vaginal delivery  Additional problems: None     Discharge diagnosis: Term Pregnancy Delivered                                                                                                Post partum procedures:None  Augmentation: AROM  Complications: None  Hospital course:  Onset of Labor With Vaginal Delivery     30 y.o. yo [id number] at 344w0d was admitted in Active Labor on 10/24/2015. Patient had an uncomplicated labor course as follows:  Membrane Rupture Time/Date: 3:25 PM ,10/24/2015   Intrapartum Procedures: Episiotomy: None [1]                                         Lacerations:  None [1]  Patient had a delivery of a Viable infant. 10/24/2015  Information for the patient's newborn:  Francena HanlyStokes, Autumn [place] [[id number]][[id number]]  Delivery Method: Vaginal, Spontaneous Delivery (Filed from Delivery Summary)    Pateint had an uncomplicated postpartum course.  She is ambulating, tolerating a regular diet, passing flatus, and urinating well. Patient is discharged home in stable condition on 11/04/15.    Physical exam Vitals:   10/24/15 2050 10/25/15 0050 10/25/15 2120 10/26/15 0524  BP: 112/64 113/63 115/65 110/61  Pulse: 85 88 76 67  Resp: 20 18 18 18   Temp: 97.9 F (36.6 C) 98.3 F (36.8 C) 98.6 F (37 C) 97.6 F (36.4 C)  TempSrc: Oral Oral Oral Oral  SpO2: 100% 99%    Weight:      Height:       General: alert, cooperative and no distress Lochia: appropriate Uterine Fundus: firm Incision: N/A DVT Evaluation: No evidence of DVT seen on physical exam. Negative Homan's sign. Labs: Lab Results  Component Value Date   WBC 10.1 10/24/2015   HGB 10.1  (L) 10/24/2015   HCT 30.5 (L) 10/24/2015   MCV 90.2 10/24/2015   PLT 393 10/24/2015   No flowsheet data found.  Discharge instruction: per After Visit Summary and "Baby and Me Booklet".  After visit meds:    Medication List    TAKE these medications   acetaminophen 325 MG tablet Commonly known as:  TYLENOL Take 2 tablets (650 mg total) by mouth every 4 (four) hours as needed (for pain scale < 4).   ibuprofen 600 MG tablet Commonly known as:  ADVIL,MOTRIN Take 1 tablet (600 mg total) by mouth every 6 (six) hours.   omeprazole 20 MG capsule Commonly known as:  PRILOSEC Take 1 capsule (20 mg total) by mouth 2 (two) times daily before a meal.   prenatal multivitamin Tabs tablet Take 1 tablet by mouth 3 (three) times daily.  senna-docusate 8.6-50 MG tablet Commonly known as:  Senokot-S Take 1 tablet by mouth daily.       Diet: routine diet  Activity: Advance as tolerated. Pelvic rest for 6 weeks.   Outpatient follow up:6 weeks Follow up Appt:Future Appointments Date Time Provider Department Center  01/13/2016 3:00 PM [facility] NURSE [facility]   Follow up Visit:No Follow-up on file.  Postpartum contraception: Nexplanon  Newborn Data: Live born female  Birth Weight: 7 lb 4.9 oz (3314 g) APGAR: 9, 9  Baby Feeding: Bottle Disposition:home with mother    Jen Mow, DO  [place] Fellow 10/26/15

## 2015-11-26 ENCOUNTER — Ambulatory Visit (INDEPENDENT_AMBULATORY_CARE_PROVIDER_SITE_OTHER): Payer: Medicaid Other | Admitting: Obstetrics and Gynecology

## 2015-11-26 ENCOUNTER — Encounter: Payer: Self-pay | Admitting: Obstetrics

## 2015-11-26 ENCOUNTER — Encounter: Payer: Self-pay | Admitting: Obstetrics and Gynecology

## 2015-11-26 DIAGNOSIS — Z3049 Encounter for surveillance of other contraceptives: Secondary | ICD-10-CM | POA: Diagnosis not present

## 2015-11-26 DIAGNOSIS — Z3202 Encounter for pregnancy test, result negative: Secondary | ICD-10-CM

## 2015-11-26 DIAGNOSIS — Z30017 Encounter for initial prescription of implantable subdermal contraceptive: Secondary | ICD-10-CM

## 2015-11-26 LAB — POCT URINE PREGNANCY: PREG TEST UR: NEGATIVE

## 2015-11-26 MED ORDER — NORGESTIMATE-ETH ESTRADIOL 0.25-35 MG-MCG PO TABS: 1.0000 | ORAL_TABLET | Freq: Every day | ORAL | 3 refills | Status: DC

## 2015-11-26 NOTE — Progress Notes (Signed)
Subjective:     Adrienne Turner is a 30 y.o. female who presents for a postpartum visit. She is 4 weeks postpartum following a spontaneous vaginal delivery. I have fully reviewed the prenatal and intrapartum course. The delivery was at 39 gestational weeks. Outcome: spontaneous vaginal delivery. Anesthesia: epidural. Postpartum course has been uncomplicated. Baby's course has been uncomplicated. Baby is feeding by both breast and bottle - Similac with Iron. Bleeding thin lochia. Bowel function is normal. Bladder function is normal. Patient is not sexually active. Contraception method is abstinence. Postpartum depression screening: negative.    Review of Systems Pertinent items are noted in HPI.   Objective:    There were no vitals taken for this visit.  General:  alert, cooperative and no distress   Breasts:  inspection negative, no nipple discharge or bleeding, no masses or nodularity palpable  Lungs: clear to auscultation bilaterally  Heart:  regular rate and rhythm  Abdomen: soft, non-tender; bowel sounds normal; no masses,  no organomegaly   Vulva:  normal  Vagina: normal vagina, no discharge, exudate, lesion, or erythema  Cervix:  multiparous appearance  Corpus: normal size, contour, position, consistency, mobility, non-tender  Adnexa:  normal adnexa and no mass, fullness, tenderness  Rectal Exam: Not performed.        Assessment:     Normal postpartum exam. Pap smear not done at today's visit (normal in 04/2014).   Plan:    1. Contraception: Nexplanon  Patient given informed consent, signed copy in the chart, time out was performed. Pregnancy test was negative. Appropriate time out taken.  Patient's left arm was prepped and draped in the usual sterile fashion.. The ruler used to measure and mark insertion area.  Patient was prepped with alcohol swab and then injected with 2 cc of 1% lidocaine with epinephrine.  Patient was prepped with betadine, Nexplanon removed form packaging.   Device confirmed in needle, then inserted full length of needle and withdrawn per handbook instructions.  Patient insertion site covered with band-aid and pressure dressing.   Minimal blood loss.  Patient tolerated the procedure well.  Patient requested a Rx for birth control pill to use for now in order to decrease the chance of irregular bleeding Patient advised to use condoms for the next 2 weeks 2. Patient is medically cleared to return to regular activities 3. Follow up in: 1 year or as needed.

## 2015-12-15 ENCOUNTER — Encounter: Payer: Self-pay | Admitting: *Deleted

## 2016-01-13 ENCOUNTER — Ambulatory Visit: Payer: Self-pay

## 2017-04-12 ENCOUNTER — Encounter (HOSPITAL_COMMUNITY): Payer: Self-pay | Admitting: Family Medicine

## 2017-04-12 ENCOUNTER — Ambulatory Visit (HOSPITAL_COMMUNITY)
Admission: EM | Admit: 2017-04-12 | Discharge: 2017-04-12 | Disposition: A | Discharge status: Home / Self Care | Payer: BLUE CROSS/BLUE SHIELD | Attending: Family Medicine | Admitting: Family Medicine

## 2017-04-12 DIAGNOSIS — R69 Illness, unspecified: Secondary | ICD-10-CM | POA: Diagnosis not present

## 2017-04-12 DIAGNOSIS — J111 Influenza due to unidentified influenza virus with other respiratory manifestations: Secondary | ICD-10-CM

## 2017-04-12 MED ORDER — OSELTAMIVIR PHOSPHATE 75 MG PO CAPS: 75.0000 mg | ORAL_CAPSULE | Freq: Two times a day (BID) | ORAL | 0 refills | Status: DC

## 2017-04-12 MED ORDER — HYDROCODONE-HOMATROPINE 5-1.5 MG/5ML PO SYRP: 5.0000 mL | ORAL_SOLUTION | Freq: Four times a day (QID) | ORAL | 0 refills | Status: DC | PRN

## 2017-04-12 NOTE — ED Triage Notes (Signed)
Pt here for fever this am 103.6. sts that she has been having throat pain and congestion. sts also some coughing.

## 2017-04-12 NOTE — ED Provider Notes (Signed)
  [facility]   [id number] 04/12/17 Arrival Time: 1306  ASSESSMENT & PLAN:  1. Influenza-like illness     Meds ordered this encounter  Medications  . HYDROcodone-homatropine (HYCODAN) 5-1.5 MG/5ML syrup    Sig: Take 5 mLs by mouth every 6 (six) hours as needed for cough.    Dispense:  90 mL    Refill:  0  . oseltamivir (TAMIFLU) 75 MG capsule    Sig: Take 1 capsule (75 mg total) by mouth every 12 (twelve) hours.    Dispense:  10 capsule    Refill:  0   Medication sedation precautions. Discussed typical duration of symptoms. OTC symptom care as needed. Ensure adequate fluid intake and rest. Work note given. May f/u with PCP or here as needed.  Reviewed expectations re: course of current medical issues. Questions answered. Outlined signs and symptoms indicating need for more acute intervention. Patient verbalized understanding. After Visit Summary given.   SUBJECTIVE: History from: patient.  Adrienne Turner is a 32 y.o. female who presents with complaint of nasal congestion, post-nasal drainage, and a persistent dry cough. Onset abrupt, approximately 1 day ago. Overall fatigued with body aches. SOB: none. Wheezing: none. Fever: yes (103 degrees F this morning). Overall normal PO intake without n/v. Sick contacts: no. OTC treatment: Tylenol with mild help.  Received flu shot this year: yes.  Social History   Tobacco Use  Smoking Status Never Smoker  Smokeless Tobacco Never Used    ROS: As per HPI.   OBJECTIVE:  Vitals:   04/12/17 1355  BP: 108/69  Pulse: (!) 106  Resp: 18  Temp: 99.8 F (37.7 C)  SpO2: 99%     General appearance: alert; appears fatigued HEENT: nasal congestion; clear runny nose; throat irritation secondary to post-nasal drainage Neck: supple without LAD Lungs: unlabored respirations, symmetrical air entry; cough: moderate; no respiratory distress Skin: warm and dry Psychological: alert and cooperative; normal mood and  affect   No Known Allergies  Past Medical History:  Diagnosis Date  . Medical history non-contributory    History reviewed. No pertinent family history. Social History   Socioeconomic History  . Marital status: Single    Spouse name: Not on file  . Number of children: Not on file  . Years of education: Not on file  . Highest education level: Not on file  Social Needs  . Financial resource strain: Not on file  . Food insecurity - worry: Not on file  . Food insecurity - inability: Not on file  . Transportation needs - medical: Not on file  . Transportation needs - non-medical: Not on file  Occupational History  . Not on file  Tobacco Use  . Smoking status: Never Smoker  . Smokeless tobacco: Never Used  Substance and Sexual Activity  . Alcohol use: No  . Drug use: No  . Sexual activity: Not Currently    Birth control/protection: None  Other Topics Concern  . Not on file  Social History Narrative  . Not on file           Mardella LaymanHagler, Makaylia Hewett, MD 04/12/17 1427

## 2017-04-12 NOTE — Discharge Instructions (Signed)

## 2017-11-29 ENCOUNTER — Encounter: Payer: Self-pay | Admitting: Obstetrics

## 2017-11-29 ENCOUNTER — Ambulatory Visit (INDEPENDENT_AMBULATORY_CARE_PROVIDER_SITE_OTHER): Payer: BLUE CROSS/BLUE SHIELD | Admitting: Obstetrics

## 2017-11-29 ENCOUNTER — Other Ambulatory Visit (HOSPITAL_COMMUNITY)
Admission: RE | Admit: 2017-11-29 | Discharge: 2017-11-29 | Disposition: A | Discharge status: Home / Self Care | Payer: BLUE CROSS/BLUE SHIELD | Source: Ambulatory Visit | Attending: Obstetrics | Admitting: Obstetrics

## 2017-11-29 VITALS — BP 110/73 | HR 73 | Ht 66.0 in | Wt 171.0 lb

## 2017-11-29 DIAGNOSIS — N898 Other specified noninflammatory disorders of vagina: Secondary | ICD-10-CM

## 2017-11-29 DIAGNOSIS — Z01419 Encounter for gynecological examination (general) (routine) without abnormal findings: Secondary | ICD-10-CM | POA: Diagnosis not present

## 2017-11-29 DIAGNOSIS — Z3046 Encounter for surveillance of implantable subdermal contraceptive: Secondary | ICD-10-CM

## 2017-11-29 DIAGNOSIS — Z113 Encounter for screening for infections with a predominantly sexual mode of transmission: Secondary | ICD-10-CM

## 2017-11-29 NOTE — Progress Notes (Signed)
Pt is 31yo G5P3 here for annual exam. Pt is not currently SA. Pt has nexplanon, inserted 11/26/15. Pt would like STD testing. Pap today.

## 2017-11-29 NOTE — Progress Notes (Signed)
Subjective:        Adrienne Turner is a 32 y.o. female here for a routine exam.  Current complaints: No complaints.    Personal health questionnaire:  Is patient Ashkenazi Jewish, have a family history of breast and/or ovarian cancer: no Is there a family history of uterine cancer diagnosed at age < 106, gastrointestinal cancer, urinary tract cancer, family member who is a Personnel officer syndrome-associated carrier: no Is the patient overweight and hypertensive, family history of diabetes, personal history of gestational diabetes, preeclampsia or PCOS: no Is patient over 68, have PCOS,  family history of premature CHD under age 76, diabetes, smoke, have hypertension or peripheral artery disease:  no At any time, has a partner hit, kicked or otherwise hurt or frightened you?: no Over the past 2 weeks, have you felt down, depressed or hopeless?: no Over the past 2 weeks, have you felt little interest or pleasure in doing things?:no   Gynecologic History Patient's last menstrual period was 11/11/2017 (exact date). Contraception: Nexplanon Last Pap: 2017. Results were: normal Last mammogram: n/a. Results were: n/a  Obstetric History OB History  Gravida Para Term Preterm AB Living  5 3 3  0 2 3  SAB TAB Ectopic Multiple Live Births  0 2 0 1 1    # Outcome Date GA Lbr Len/2nd Weight Sex Delivery Anes PTL Lv  5 Term 10/24/15 [redacted]w[redacted]d 14:43 / 00:06 7 lb 4.9 oz (3.314 kg) F Vag-Spont EPI  LIV  4 Term           3 Term           2 TAB           1 TAB              Birth Comments: System Generated. Please review and update pregnancy details.    Past Medical History:  Diagnosis Date  . Medical history non-contributory     Past Surgical History:  Procedure Laterality Date  . NO PAST SURGERIES       Current Outpatient Medications:  .  acetaminophen (TYLENOL) 325 MG tablet, Take 2 tablets (650 mg total) by mouth every 4 (four) hours as needed (for pain scale < 4). (Patient not taking: Reported  on 11/29/2017), Disp: 90 tablet, Rfl: 1 .  HYDROcodone-homatropine (HYCODAN) 5-1.5 MG/5ML syrup, Take 5 mLs by mouth every 6 (six) hours as needed for cough. (Patient not taking: Reported on 11/29/2017), Disp: 90 mL, Rfl: 0 .  ibuprofen (ADVIL,MOTRIN) 600 MG tablet, Take 1 tablet (600 mg total) by mouth every 6 (six) hours. (Patient not taking: Reported on 11/29/2017), Disp: 30 tablet, Rfl: 0 .  norgestimate-ethinyl estradiol (ORTHO-CYCLEN,SPRINTEC,PREVIFEM) 0.25-35 MG-MCG tablet, Take 1 tablet by mouth daily. (Patient not taking: Reported on 11/29/2017), Disp: 1 Package, Rfl: 3 .  oseltamivir (TAMIFLU) 75 MG capsule, Take 1 capsule (75 mg total) by mouth every 12 (twelve) hours. (Patient not taking: Reported on 11/29/2017), Disp: 10 capsule, Rfl: 0 .  Prenatal Vit-Fe Fumarate-FA (PRENATAL MULTIVITAMIN) TABS tablet, Take 1 tablet by mouth 3 (three) times daily., Disp: , Rfl:  .  senna-docusate (SENOKOT-S) 8.6-50 MG tablet, Take 1 tablet by mouth daily. (Patient not taking: Reported on 11/29/2017), Disp: 60 tablet, Rfl: 0 No Known Allergies  Social History   Tobacco Use  . Smoking status: Never Smoker  . Smokeless tobacco: Never Used  Substance Use Topics  . Alcohol use: No    History reviewed. No pertinent family history.    Review  of Systems  Constitutional: negative for fatigue and weight loss Respiratory: negative for cough and wheezing Cardiovascular: negative for chest pain, fatigue and palpitations Gastrointestinal: negative for abdominal pain and change in bowel habits Musculoskeletal:negative for myalgias Neurological: negative for gait problems and tremors Behavioral/Psych: negative for abusive relationship, depression Endocrine: negative for temperature intolerance    Genitourinary:negative for abnormal menstrual periods, genital lesions, hot flashes, sexual problems and vaginal discharge Integument/breast: negative for breast lump, breast tenderness, nipple discharge and skin  lesion(s)    Objective:       BP 110/73   Pulse 73   Ht 5\' 6"  (1.676 m)   Wt 171 lb (77.6 kg)   LMP 11/11/2017 (Exact Date)   BMI 27.60 kg/m  General:   alert  Skin:   no rash or abnormalities  Lungs:   clear to auscultation bilaterally  Heart:   regular rate and rhythm, S1, S2 normal, no murmur, click, rub or gallop  Breasts:   normal without suspicious masses, skin or nipple changes or axillary nodes  Abdomen:  normal findings: no organomegaly, soft, non-tender and no hernia  Pelvis:  External genitalia: normal general appearance Urinary system: urethral meatus normal and bladder without fullness, nontender Vaginal: normal without tenderness, induration or masses Cervix: normal appearance Adnexa: normal bimanual exam Uterus: anteverted and non-tender, normal size   Lab Review Urine pregnancy test Labs reviewed yes Radiologic studies reviewed no  50% of 20 min visit spent on counseling and coordination of care.   Assessment:    Healthy female exam.    Plan:    Education reviewed: calcium supplements, depression evaluation, low fat, low cholesterol diet, safe sex/STD prevention, self breast exams and weight bearing exercise. Contraception: Nexplanon. Follow up in: 1 year.   No orders of the defined types were placed in this encounter.  Orders Placed This Encounter  Procedures  . Hepatitis B surface antigen  . Hepatitis C antibody  . HIV antibody  . RPR     Brock Bad MD 11-29-2017

## 2017-11-30 LAB — CERVICOVAGINAL ANCILLARY ONLY
Bacterial vaginitis: POSITIVE — AB
Candida vaginitis: NEGATIVE
Chlamydia: NEGATIVE
NEISSERIA GONORRHEA: NEGATIVE
TRICH (WINDOWPATH): NEGATIVE

## 2017-11-30 LAB — HIV ANTIBODY (ROUTINE TESTING W REFLEX): HIV SCREEN 4TH GENERATION: NONREACTIVE

## 2017-11-30 LAB — RPR: RPR: NONREACTIVE

## 2017-11-30 LAB — HEPATITIS B SURFACE ANTIGEN: HEP B S AG: NEGATIVE

## 2017-11-30 LAB — HEPATITIS C ANTIBODY: HEP C VIRUS AB: 0.1 {s_co_ratio} (ref 0.0–0.9)

## 2017-12-01 ENCOUNTER — Other Ambulatory Visit: Payer: Self-pay | Admitting: Obstetrics

## 2017-12-01 DIAGNOSIS — N760 Acute vaginitis: Secondary | ICD-10-CM

## 2017-12-01 DIAGNOSIS — B9689 Other specified bacterial agents as the cause of diseases classified elsewhere: Secondary | ICD-10-CM

## 2017-12-01 MED ORDER — METRONIDAZOLE 500 MG PO TABS
500.0000 mg | ORAL_TABLET | Freq: Two times a day (BID) | ORAL | 2 refills | Status: DC
Start: 2017-12-01 — End: 2022-01-14

## 2017-12-02 LAB — CYTOLOGY - PAP
Diagnosis: NEGATIVE
HPV: NOT DETECTED

## 2018-12-13 ENCOUNTER — Inpatient Hospital Stay (HOSPITAL_COMMUNITY): Admit: 2018-12-13 | Payer: BLUE CROSS/BLUE SHIELD

## 2018-12-13 ENCOUNTER — Ambulatory Visit (INDEPENDENT_AMBULATORY_CARE_PROVIDER_SITE_OTHER): Payer: BLUE CROSS/BLUE SHIELD | Admitting: Family Medicine

## 2018-12-13 ENCOUNTER — Other Ambulatory Visit: Payer: Self-pay

## 2018-12-13 ENCOUNTER — Encounter: Payer: Self-pay | Admitting: Family Medicine

## 2018-12-13 VITALS — BP 106/70 | HR 64 | Temp 98.5°F | Ht 66.0 in | Wt 183.7 lb

## 2018-12-13 DIAGNOSIS — Z113 Encounter for screening for infections with a predominantly sexual mode of transmission: Secondary | ICD-10-CM

## 2018-12-13 DIAGNOSIS — B9689 Other specified bacterial agents as the cause of diseases classified elsewhere: Secondary | ICD-10-CM

## 2018-12-13 DIAGNOSIS — Z01419 Encounter for gynecological examination (general) (routine) without abnormal findings: Secondary | ICD-10-CM | POA: Diagnosis not present

## 2018-12-13 DIAGNOSIS — N898 Other specified noninflammatory disorders of vagina: Secondary | ICD-10-CM

## 2018-12-13 DIAGNOSIS — Z30017 Encounter for initial prescription of implantable subdermal contraceptive: Secondary | ICD-10-CM | POA: Insufficient documentation

## 2018-12-13 DIAGNOSIS — Z3046 Encounter for surveillance of implantable subdermal contraceptive: Secondary | ICD-10-CM | POA: Diagnosis not present

## 2018-12-13 DIAGNOSIS — B373 Candidiasis of vulva and vagina: Secondary | ICD-10-CM | POA: Diagnosis not present

## 2018-12-13 DIAGNOSIS — N760 Acute vaginitis: Secondary | ICD-10-CM

## 2018-12-13 MED ORDER — ETONOGESTREL 68 MG ~~LOC~~ IMPL
68.0000 mg | DRUG_IMPLANT | Freq: Once | SUBCUTANEOUS | Status: AC
  Administered 2018-12-13: 68 mg via SUBCUTANEOUS

## 2018-12-13 NOTE — Progress Notes (Signed)
     GYNECOLOGY OFFICE PROCEDURE NOTE  [name] is a 33 y.o. [phone] here for Nexplanon removal and Nexplanon insertion.  Last pap smear was on 11/2017 and was normal.  No other gynecologic concerns.  Nexplanon Removal and Insertion  Patient identified, informed consent performed, consent signed.   Patient does understand that irregular bleeding is a very common side effect of this medication. Appropriate time out taken. Implanon site identified. Area prepped in usual sterile fashon. One ml of 1% lidocaine was used to anesthetize the area at the distal end of the implant. A small stab incision was made right beside the implant on the distal portion. The Nexplanon rod was grasped using hemostats and removed without difficulty. There was minimal blood loss. There were no complications. Area was then injected with 3 ml of 1 % lidocaine. She was re-prepped with betadine, Nexplanon removed from packaging, Device confirmed in needle, then inserted full length of needle and withdrawn per handbook instructions. Nexplanon was able to palpated in the patient's arm; patient palpated the insert herself.  There was minimal blood loss. Patient insertion site covered with guaze and a pressure bandage to reduce any bruising. The patient tolerated the procedure well and was given post procedure instructions.  She was advised to have backup contraception for one week.    Clarnce Flock OB Fellow

## 2018-12-13 NOTE — Patient Instructions (Signed)
Preventive Care 21-32 Years Old, Female Preventive care refers to visits with your health care provider and lifestyle choices that can promote health and wellness. This includes:  A yearly physical exam. This may also be called an annual well check.  Regular dental visits and eye exams.  Immunizations.  Screening for certain conditions.  Healthy lifestyle choices, such as eating a healthy diet, getting regular exercise, not using drugs or products that contain nicotine and tobacco, and limiting alcohol use. What can I expect for my preventive care visit? Physical exam Your health care provider will check your:  Height and weight. This may be used to calculate body mass index (BMI), which tells if you are at a healthy weight.  Heart rate and blood pressure.  Skin for abnormal spots. Counseling Your health care provider may ask you questions about your:  Alcohol, tobacco, and drug use.  Emotional well-being.  Home and relationship well-being.  Sexual activity.  Eating habits.  Work and work environment.  Method of birth control.  Menstrual cycle.  Pregnancy history. What immunizations do I need?  Influenza (flu) vaccine  This is recommended every year. Tetanus, diphtheria, and pertussis (Tdap) vaccine  You may need a Td booster every 10 years. Varicella (chickenpox) vaccine  You may need this if you have not been vaccinated. Human papillomavirus (HPV) vaccine  If recommended by your health care provider, you may need three doses over 6 months. Measles, mumps, and rubella (MMR) vaccine  You may need at least one dose of MMR. You may also need a second dose. Meningococcal conjugate (MenACWY) vaccine  One dose is recommended if you are age 19-21 years and a first-year college student living in a residence hall, or if you have one of several medical conditions. You may also need additional booster doses. Pneumococcal conjugate (PCV13) vaccine  You may need  this if you have certain conditions and were not previously vaccinated. Pneumococcal polysaccharide (PPSV23) vaccine  You may need one or two doses if you smoke cigarettes or if you have certain conditions. Hepatitis A vaccine  You may need this if you have certain conditions or if you travel or work in places where you may be exposed to hepatitis A. Hepatitis B vaccine  You may need this if you have certain conditions or if you travel or work in places where you may be exposed to hepatitis B. Haemophilus influenzae type b (Hib) vaccine  You may need this if you have certain conditions. You may receive vaccines as individual doses or as more than one vaccine together in one shot (combination vaccines). Talk with your health care provider about the risks and benefits of combination vaccines. What tests do I need?  Blood tests  Lipid and cholesterol levels. These may be checked every 5 years starting at age 20.  Hepatitis C test.  Hepatitis B test. Screening  Diabetes screening. This is done by checking your blood sugar (glucose) after you have not eaten for a while (fasting).  Sexually transmitted disease (STD) testing.  BRCA-related cancer screening. This may be done if you have a family history of breast, ovarian, tubal, or peritoneal cancers.  Pelvic exam and Pap test. This may be done every 3 years starting at age 21. Starting at age 30, this may be done every 5 years if you have a Pap test in combination with an HPV test. Talk with your health care provider about your test results, treatment options, and if necessary, the need for more tests.   Follow these instructions at home: Eating and drinking   Eat a diet that includes fresh fruits and vegetables, whole grains, lean protein, and low-fat dairy.  Take vitamin and mineral supplements as recommended by your health care provider.  Do not drink alcohol if: ? Your health care provider tells you not to drink. ? You are  pregnant, may be pregnant, or are planning to become pregnant.  If you drink alcohol: ? Limit how much you have to 0-1 drink a day. ? Be aware of how much alcohol is in your drink. In the [place] one drink equals one 12 oz bottle of beer (355 mL), one 5 oz glass of wine (148 mL), or one 1 oz glass of hard liquor (44 mL). Lifestyle  Take daily care of your teeth and gums.  Stay active. Exercise for at least 30 minutes on 5 or more days each week.  Do not use any products that contain nicotine or tobacco, such as cigarettes, e-cigarettes, and chewing tobacco. If you need help quitting, ask your health care provider.  If you are sexually active, practice safe sex. Use a condom or other form of birth control (contraception) in order to prevent pregnancy and STIs (sexually transmitted infections). If you plan to become pregnant, see your health care provider for a preconception visit. What's next?  Visit your health care provider once a year for a well check visit.  Ask your health care provider how often you should have your eyes and teeth checked.  Stay up to date on all vaccines. This information is not intended to replace advice given to you by your health care provider. Make sure you discuss any questions you have with your health care provider. Document Released: 05/11/2001 Document Revised: 11/24/2017 Document Reviewed: 11/24/2017 Elsevier Patient Education  [place] Instructions After Insertion   Keep bandage clean and dry for 24 hours   May use ice/Tylenol/Ibuprofen for soreness or pain   If you develop fever, drainage or increased warmth from incision site-contact office immediately

## 2018-12-13 NOTE — Assessment & Plan Note (Signed)
Due for removal of nexplanon and elects to have it reinserted, see procedure note, uncomplicated removal/reinsertion.

## 2018-12-13 NOTE — Progress Notes (Addendum)
GYN presents for AEX/Nexplanon removal and Insert.  She wants STD testing.  Administrations This Visit    etonogestrel (NEXPLANON) implant 68 mg    Admin Date 12/13/2018 Action Given Dose 68 mg Route Subdermal Administered By Tamela Oddi, RMA

## 2018-12-13 NOTE — Assessment & Plan Note (Signed)
-  Well woman in good health. -Up to date on pap. -Some family hx of cancer, encouraged her to clarify age and order of diagnoses of aunt's cancers to clarify for next visit if she would be candidate for early mammogram or other screening. -Requests STI screening today -See separate problem for contraception plan -Reviewed overweight BMI, importance of diet and exercise

## 2018-12-13 NOTE — Progress Notes (Signed)
   GYNECOLOGY OFFICE VISIT NOTE  History:   Adrienne Turner is a 33 y.o. [phone] here today for annual exam and nexplanon removal/insertion.  No complaints Would like to continue with Nexplanon for contraception Last pap 11/2017 was NILM and HPV negative, not due until 11/2022 Reports paternal aunt w breast and lung CA, but not sure of her age Maternal grandmother with ovarian CA, not sure of age No family hx of colon CA  Is trying to lose weight  Would like STI screen  Past Medical History:  Diagnosis Date  . Medical history non-contributory     Past Surgical History:  Procedure Laterality Date  . NO PAST SURGERIES     History reviewed. No pertinent family history.   The following portions of the patient's history were reviewed and updated as appropriate: allergies, current medications, past family history, past medical history, past social history, past surgical history and problem list.   Health Maintenance:  Normal pap and negative HRHPV on 11/2017.  Normal mammogram: n/a.   Review of Systems:  Pertinent items noted in HPI and remainder of comprehensive ROS otherwise negative.  Physical Exam:  BP 106/70   Pulse 64   Temp 98.5 F (36.9 C)   Ht 5\' 6"  (1.676 m)   Wt 183 lb 11.2 oz (83.3 kg)   LMP 12/01/2018 (Exact Date)   BMI 29.65 kg/m  CONSTITUTIONAL: Well-developed, well-nourished female in no acute distress.  HEENT:  Normocephalic, atraumatic. External right and left ear normal. No scleral icterus.  NECK: Normal range of motion, supple, no masses noted on observation SKIN: No rash noted. Not diaphoretic. No erythema. No pallor. MUSCULOSKELETAL: Normal range of motion. No edema noted. NEUROLOGIC: Alert and oriented to person, place, and time. Normal muscle tone coordination.  PSYCHIATRIC: Normal mood and affect. Normal behavior. Normal judgment and thought content. RESPIRATORY: Effort normal, no problems with respiration noted ABDOMEN: No masses noted. No other  overt distention noted.   PELVIC: Deferred  Labs and Imaging No results found for this or any previous visit (from the past 168 hour(s)). No results found.    Assessment and Plan:   Problem List Items Addressed This Visit      Other   Nexplanon insertion    Due for removal of nexplanon and elects to have it reinserted, see procedure note, uncomplicated removal/reinsertion.       Relevant Medications   etonogestrel (NEXPLANON) implant 68 mg (Start on 12/13/2018  4:15 PM)   Well woman exam - Primary    -Well woman in good health. -Up to date on pap. -Some family hx of cancer, encouraged her to clarify age and order of diagnoses of aunt's cancers to clarify for next visit if she would be candidate for early mammogram or other screening. -Requests STI screening today -See separate problem for contraception plan -Reviewed overweight BMI, importance of diet and exercise       Other Visit Diagnoses    Nexplanon removal       Routine screening for STI (sexually transmitted infection)       Relevant Orders   Cervicovaginal ancillary only( [place])      Routine preventative health maintenance measures emphasized.  Please refer to After Visit Summary for other counseling recommendations.   Return in about 1 year (around 12/13/2019) for Annual Wellness Visit.    Augustin Coupe, [place] for [employer], [place]

## 2018-12-14 LAB — CERVICOVAGINAL ANCILLARY ONLY
Bacterial Vaginitis (gardnerella): POSITIVE — AB
Candida Glabrata: NEGATIVE
Candida Vaginitis: POSITIVE — AB
Molecular Disclaimer: NEGATIVE
Molecular Disclaimer: NEGATIVE
Molecular Disclaimer: NEGATIVE
Molecular Disclaimer: NORMAL
Trichomonas: NEGATIVE

## 2018-12-15 LAB — CERVICOVAGINAL ANCILLARY ONLY
Chlamydia: NEGATIVE
Neisseria Gonorrhea: NEGATIVE

## 2018-12-19 ENCOUNTER — Other Ambulatory Visit: Payer: Self-pay

## 2018-12-19 MED ORDER — METRONIDAZOLE 500 MG PO TABS: 500.0000 mg | ORAL_TABLET | Freq: Two times a day (BID) | ORAL | 0 refills | Status: DC

## 2018-12-19 MED ORDER — FLUCONAZOLE 150 MG PO TABS: 150.0000 mg | ORAL_TABLET | Freq: Once | ORAL | 3 refills | Status: AC

## 2018-12-19 NOTE — Progress Notes (Signed)
Pt made aware of lab results, rx's sent to pharmacy. Pt verbalizes understanding.

## 2022-01-14 ENCOUNTER — Ambulatory Visit (INDEPENDENT_AMBULATORY_CARE_PROVIDER_SITE_OTHER): Payer: 59 | Admitting: Obstetrics and Gynecology

## 2022-01-14 ENCOUNTER — Encounter: Payer: Self-pay | Admitting: Obstetrics and Gynecology

## 2022-01-14 ENCOUNTER — Other Ambulatory Visit (HOSPITAL_COMMUNITY)
Admission: RE | Admit: 2022-01-14 | Discharge: 2022-01-14 | Disposition: A | Discharge status: Home / Self Care | Payer: 59 | Source: Ambulatory Visit | Attending: Obstetrics and Gynecology | Admitting: Obstetrics and Gynecology

## 2022-01-14 VITALS — BP 107/71 | HR 81 | Ht 66.0 in | Wt 198.0 lb

## 2022-01-14 DIAGNOSIS — Z01419 Encounter for gynecological examination (general) (routine) without abnormal findings: Secondary | ICD-10-CM | POA: Diagnosis present

## 2022-01-14 DIAGNOSIS — Z30011 Encounter for initial prescription of contraceptive pills: Secondary | ICD-10-CM

## 2022-01-14 DIAGNOSIS — Z3046 Encounter for surveillance of implantable subdermal contraceptive: Secondary | ICD-10-CM

## 2022-01-14 DIAGNOSIS — N62 Hypertrophy of breast: Secondary | ICD-10-CM

## 2022-01-14 LAB — POCT URINALYSIS DIPSTICK
Bilirubin, UA: NEGATIVE
Glucose, UA: NEGATIVE
Ketones, UA: NEGATIVE
Leukocytes, UA: NEGATIVE
Nitrite, UA: POSITIVE
Protein, UA: NEGATIVE
Spec Grav, UA: 1.015 (ref 1.010–1.025)
Urobilinogen, UA: 0.2 E.U./dL
pH, UA: 6 (ref 5.0–8.0)

## 2022-01-14 MED ORDER — LO LOESTRIN FE 1 MG-10 MCG / 10 MCG PO TABS: 1.0000 | ORAL_TABLET | Freq: Every day | ORAL | 6 refills | Status: AC

## 2022-01-14 NOTE — Progress Notes (Signed)
ANNUAL EXAM Patient name: Adrienne Turner MRN [id number]  Date of birth: [birth year] Chief Complaint:   Annual Exam and Contraception  History of Present Illness:   Adrienne Turner is a 36 y.o. [phone] female being seen today for a routine annual exam.  Current complaints: Would like nexplanon removed Reports abnormal bleeding with Nexplanon placed.  Would like to have Nexplanon removed today, and start birth control pills.  Birth control pills for contraception, no concerns for menses outside of irregularities and Nexplanon in place.  Not interested in childbearing at this time.  Also interested in referral to plastic surgery for breast reduction surgery.  Patient's last menstrual period was 12/31/2021.   The pregnancy intention screening data noted above was reviewed. Potential methods of contraception were discussed. The patient elected to proceed with No data recorded.   Last pap 11/2017 NILM, HPV neg       No data to display              12/13/2018    3:06 PM  GAD 7 : Generalized Anxiety Score  Nervous, Anxious, on Edge 0  Control/stop worrying 0  Worry too much - different things 0  Trouble relaxing 0  Restless 0  Easily annoyed or irritable 0  Afraid - awful might happen 0  Total GAD 7 Score 0  Anxiety Difficulty Not difficult at all     Review of Systems:   Pertinent items are noted in HPI Denies any headaches, blurred vision, fatigue, shortness of breath, chest pain, abdominal pain, abnormal vaginal discharge/itching/odor/irritation, problems with periods, bowel movements, urination, or intercourse unless otherwise stated above. Pertinent History Reviewed:  Reviewed past medical,surgical, social and family history.  Reviewed problem list, medications and allergies. Physical Assessment:   Vitals:   01/14/22 1017  BP: 107/71  Pulse: 81  Weight: 198 lb (89.8 kg)  Height: 5\' 6"  (1.676 m)  Body mass index is 31.96 kg/m.        Physical Examination:    General appearance - well appearing, and in no distress  Mental status - alert, oriented to person, place, and time  Psych:  She has a normal mood and affect  Skin - warm and dry, normal color, no suspicious lesions noted, nexplanon palpated in left upper arm  Chest - effort normal, all lung fields clear to auscultation bilaterally  Heart - normal rate and regular rhythm  Breasts - breasts appear normal, no suspicious masses, no skin or nipple changes or axillary nodes  Abdomen - soft, nontender, nondistended, no masses or organomegaly  Pelvic - VULVA: normal appearing vulva with no masses, tenderness or lesions  VAGINA: normal appearing vagina with normal color and discharge, no lesions  CERVIX: normal appearing cervix without discharge or lesions, no CMT  Thin prep pap is done with HR HPV cotesting  UTERUS: uterus is felt to be normal size, shape, consistency and nontender   ADNEXA: No adnexal masses or tenderness noted.  Extremities:  No swelling or varicosities noted  Chaperone present for exam  NEXPLANON REMOVAL The risks (including infection, bleeding, pain, and uterine perforation) and benefits of the procedure were explained to the patient and Written informed consent was obtained.   Device was palpated in left upper arm. After time out, the skin was cleaned with alcohol and infiltrated with 2cc of 1% lidocaine including the subcutaneous tissue deep to the device. The area was cleaned with betadine x3.  Using an 11 blade, the skin was incised and the  implant was removed intact.  The implant was shown to the patient.  The skin was cleaned, incision covered with Steri-Strips, and an adhesive bandage.  Arm was wrapped and post procedure instructions provided to the patient.  OCPs chose as new contraceptive method.   No results found for this or any previous visit (from the past 24 hour(s)).  Assessment & Plan:  1. Well woman exam with routine gynecological exam Screening completed  toda - Cervicovaginal ancillary only( [place]) - HIV Antibody (routine testing w rflx) - Hepatitis B surface antigen - RPR - Hepatitis C antibody - Cytology - PAP( [place]) - POCT Urinalysis Dipstick - LO LOESTRIN FE 1 MG-10 MCG / 10 MCG tablet; Take 1 tablet by mouth daily.  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 Encounter for initial prescription of contraceptive pills S/p uncomplicated nexplanon removal, rx sent to [place] FE 1 MG-10 MCG / 10 MCG tablet; Take 1 tablet by mouth daily.  Dispense: 60 tablet; Refill: 6   Orders Placed This Encounter  Procedures   HIV Antibody (routine testing w rflx)   Hepatitis B surface antigen   RPR   Hepatitis C antibody   Ambulatory referral to Plastic Surgery   POCT Urinalysis Dipstick    Meds:  Meds ordered this encounter  Medications   LO LOESTRIN FE 1 MG-10 MCG / 10 MCG tablet    Sig: Take 1 tablet by mouth daily.    Dispense:  60 tablet    Refill:  6    Submit other coverage code 3    BIN: [id number]  PCN: [id number]    GRP: [id number]    ID: [id number]    Follow-up: No follow-ups on file.  Darliss Cheney, MD 01/14/2022 11:05 AM

## 2022-01-14 NOTE — Patient Instructions (Addendum)
Plastic surgery referral placed for breast reduction   [email or web address]   for additional information about birth control methods

## 2022-01-14 NOTE — Progress Notes (Signed)
Pt states she would like to switch BC methods.

## 2022-01-15 LAB — RPR: RPR Ser Ql: NONREACTIVE

## 2022-01-15 LAB — CERVICOVAGINAL ANCILLARY ONLY
Bacterial Vaginitis (gardnerella): POSITIVE — AB
Candida Glabrata: NEGATIVE
Candida Vaginitis: POSITIVE — AB
Chlamydia: POSITIVE — AB
Comment: NEGATIVE
Comment: NEGATIVE
Comment: NEGATIVE
Comment: NEGATIVE
Comment: NEGATIVE
Comment: NORMAL
Neisseria Gonorrhea: NEGATIVE
Trichomonas: NEGATIVE

## 2022-01-15 LAB — HIV ANTIBODY (ROUTINE TESTING W REFLEX): HIV Screen 4th Generation wRfx: NONREACTIVE

## 2022-01-15 LAB — CYTOLOGY - PAP
Comment: NEGATIVE
Diagnosis: NEGATIVE
High risk HPV: NEGATIVE

## 2022-01-15 LAB — HEPATITIS C ANTIBODY: Hep C Virus Ab: NONREACTIVE

## 2022-01-15 LAB — HEPATITIS B SURFACE ANTIGEN: Hepatitis B Surface Ag: NEGATIVE

## 2022-01-18 ENCOUNTER — Telehealth: Payer: Self-pay

## 2022-01-18 ENCOUNTER — Other Ambulatory Visit: Payer: Self-pay | Admitting: Obstetrics and Gynecology

## 2022-01-18 ENCOUNTER — Other Ambulatory Visit: Payer: Self-pay

## 2022-01-18 DIAGNOSIS — N760 Acute vaginitis: Secondary | ICD-10-CM

## 2022-01-18 DIAGNOSIS — B3731 Acute candidiasis of vulva and vagina: Secondary | ICD-10-CM

## 2022-01-18 DIAGNOSIS — N390 Urinary tract infection, site not specified: Secondary | ICD-10-CM

## 2022-01-18 DIAGNOSIS — A749 Chlamydial infection, unspecified: Secondary | ICD-10-CM

## 2022-01-18 LAB — URINE CULTURE

## 2022-01-18 MED ORDER — FLUCONAZOLE 150 MG PO TABS: 150.0000 mg | ORAL_TABLET | Freq: Once | ORAL | 1 refills | Status: AC

## 2022-01-18 MED ORDER — SULFAMETHOXAZOLE-TRIMETHOPRIM 800-160 MG PO TABS: 1.0000 | ORAL_TABLET | Freq: Two times a day (BID) | ORAL | 0 refills | Status: DC

## 2022-01-18 MED ORDER — DOXYCYCLINE HYCLATE 100 MG PO CAPS: 100.0000 mg | ORAL_CAPSULE | Freq: Two times a day (BID) | ORAL | 0 refills | Status: AC

## 2022-01-18 MED ORDER — METRONIDAZOLE 0.75 % VA GEL: 1.0000 | Freq: Every day | VAGINAL | 1 refills | Status: DC

## 2022-01-18 NOTE — Telephone Encounter (Signed)
-----   Message from Darliss Cheney, MD sent at 01/18/2022 12:20 PM EDT ----- Contacted patient and inform that she has BV, yeast, chlamydia and a UTI (urinary tract infection). Chlamydia is sexually transmitted and recommend treatment for her and her partner and later a TOC.  Normal pap, due for repeat in 3-5 years. Remaining STI tests negative.

## 2022-01-18 NOTE — Telephone Encounter (Signed)
Pt informed of lab results. Pt advised to let partner(s) know so partner(s) can be tested and treated as well. Pt advised to abstain from any unprotected intercourse for at least 7-10 days after treatment. Pt has no further questions at this time. Rx sent to [facility] on [address].

## 2022-04-18 ENCOUNTER — Other Ambulatory Visit: Payer: Self-pay

## 2022-04-18 ENCOUNTER — Encounter: Payer: Self-pay | Admitting: Emergency Medicine

## 2022-04-18 ENCOUNTER — Ambulatory Visit
Admission: EM | Admit: 2022-04-18 | Discharge: 2022-04-18 | Disposition: A | Discharge status: Home / Self Care | Payer: Medicaid Other | Attending: Emergency Medicine | Admitting: Emergency Medicine

## 2022-04-18 DIAGNOSIS — N760 Acute vaginitis: Secondary | ICD-10-CM | POA: Diagnosis present

## 2022-04-18 DIAGNOSIS — Z3202 Encounter for pregnancy test, result negative: Secondary | ICD-10-CM

## 2022-04-18 DIAGNOSIS — B9689 Other specified bacterial agents as the cause of diseases classified elsewhere: Secondary | ICD-10-CM | POA: Diagnosis present

## 2022-04-18 DIAGNOSIS — N898 Other specified noninflammatory disorders of vagina: Secondary | ICD-10-CM | POA: Insufficient documentation

## 2022-04-18 LAB — POCT URINALYSIS DIP (MANUAL ENTRY)
Bilirubin, UA: NEGATIVE
Glucose, UA: NEGATIVE mg/dL
Ketones, POC UA: NEGATIVE mg/dL
Leukocytes, UA: NEGATIVE
Nitrite, UA: NEGATIVE
Protein Ur, POC: NEGATIVE mg/dL
Spec Grav, UA: >=1.03 — AB (ref 1.010–1.025)
Urobilinogen, UA: 0.2 E.U./dL
pH, UA: 6 (ref 5.0–8.0)

## 2022-04-18 LAB — POCT URINE PREGNANCY: Preg Test, Ur: NEGATIVE

## 2022-04-18 MED ORDER — METRONIDAZOLE 0.75 % VA GEL: 1.0000 | Freq: Every day | VAGINAL | 1 refills | Status: DC

## 2022-04-18 NOTE — ED Provider Notes (Signed)
[facility]    CSN: [id number] Arrival date & time: 04/18/22  0957      History   Chief Complaint Chief Complaint  Patient presents with   Vaginal Discharge    HPI Adrienne Turner is a 37 y.o. female.   Patient presents for evaluation of clear to [facility] thin discharge with odor present for 3 days.  Has not attempted treatment of symptoms.  Sexually active, no concern for STD, no known exposure.  IUD removed in November 2023, irregular menstruation since.  Denies urinary symptoms, vaginal itching or irritation, lower abdominal pain or pressure, flank pain, fevers.  Patiently treated for yeast infection.   Past Medical History:  Diagnosis Date   Medical history non-contributory     Patient Active Problem List   Diagnosis Date Noted   Nexplanon insertion 12/13/2018   Well woman exam 12/13/2018   Normal spontaneous vaginal delivery 10/26/2015   Active labor 10/24/2015   Supervision of normal pregnancy in third trimester 10/09/2015    Past Surgical History:  Procedure Laterality Date   NO PAST SURGERIES      OB History     Gravida  5   Para  3   Term  3   Preterm  0   AB  2   Living  3      SAB  0   IAB  2   Ectopic  0   Multiple  1   Live Births  1            Home Medications    Prior to Admission medications   Medication Sig Start Date End Date Taking? Authorizing Provider  acetaminophen (TYLENOL) 325 MG tablet Take 2 tablets (650 mg total) by mouth every 4 (four) hours as needed (for pain scale < 4). Patient not taking: Reported on 11/29/2017 10/26/15   Mum[place], Lauralyn Primes, DO  ibuprofen (ADVIL,MOTRIN) 600 MG tablet Take 1 tablet (600 mg total) by mouth every 6 (six) hours. Patient not taking: Reported on 11/29/2017 10/26/15   [place]um[place], Lauralyn Primes, DO  LO LOESTRIN FE 1 MG-10 MCG / 10 MCG tablet Take 1 tablet by mouth daily. 01/14/22   Darliss Cheney, MD  metroNIDAZOLE (METROGEL) 0.75 % vaginal gel Place 1  Applicatorful vaginally at bedtime. Apply one applicatorful to vagina at bedtime for 10 days, then twice a week for 6 months. Patient not taking: Reported on 04/18/2022 01/18/22   Darliss Cheney, MD  Prenatal Vit-Fe Fumarate-FA (PRENATAL MULTIVITAMIN) TABS tablet Take 1 tablet by mouth 3 (three) times daily. Patient not taking: Reported on 01/14/2022    [provider]  sulfamethoxazole-trimethoprim (BACTRIM DS) 800-160 MG tablet Take 1 tablet by mouth 2 (two) times daily. Patient not taking: Reported on 04/18/2022 01/18/22   Darliss Cheney, MD    Family History History reviewed. No pertinent family history.  Social History Social History   Tobacco Use   Smoking status: Never   Smokeless tobacco: Never  Substance Use Topics   Alcohol use: No   Drug use: No     Allergies   Patient has no known allergies.   Review of Systems Review of Systems  Constitutional: Negative.   HENT: Negative.    Respiratory: Negative.    Cardiovascular: Negative.   Genitourinary:  Positive for vaginal discharge. Negative for decreased urine volume, difficulty urinating, dyspareunia, dysuria, enuresis, flank pain, frequency, genital sores, hematuria, menstrual problem, pelvic pain, urgency, vaginal bleeding and vaginal pain.  Skin: Negative.  Physical Exam Triage Vital Signs ED Triage Vitals  Enc Vitals Group     BP 04/18/22 1031 110/71     Pulse Rate 04/18/22 1031 78     Resp 04/18/22 1031 18     Temp 04/18/22 1031 99 F (37.2 C)     Temp Source 04/18/22 1031 Oral     SpO2 04/18/22 1031 99 %     Weight --      Height --      Head Circumference --      Peak Flow --      Pain Score 04/18/22 1032 0     Pain Loc --      Pain Edu? --      Excl. in GC? --    No data found.  Updated Vital Signs BP 110/71 (BP Location: Left Arm)   Pulse 78   Temp 99 F (37.2 C) (Oral)   Resp 18   SpO2 99%   Visual Acuity Right Eye Distance:   Left Eye Distance:   Bilateral  Distance:    Right Eye Near:   Left Eye Near:    Bilateral Near:     Physical Exam Constitutional:      Appearance: Normal appearance.  Eyes:     Extraocular Movements: Extraocular movements intact.  Pulmonary:     Effort: Pulmonary effort is normal.  Genitourinary:    Comments: deferred Skin:    General: Skin is warm and dry.  Neurological:     Mental Status: She is alert and oriented to person, place, and time. Mental status is at baseline.      UC Treatments / Results  Labs (all labs ordered are listed, but only abnormal results are displayed) Labs Reviewed  POCT URINALYSIS DIP (MANUAL ENTRY) - Abnormal; Notable for the following components:      Result Value   Spec Grav, UA >=1.030 (*)    Blood, UA trace-intact (*)    All other components within normal limits  POCT URINE PREGNANCY  CERVICOVAGINAL ANCILLARY ONLY    EKG   Radiology No results found.  Procedures Procedures (including critical care time)  Medications Ordered in UC Medications - No data to display  Initial Impression / Assessment and Plan / UC Course  I have reviewed the triage vital signs and the nursing notes.  Pertinent labs & imaging results that were available during my care of the patient were reviewed by me and considered in my medical decision making (see chart for details).  Vaginal discharge  Urinalysis and urine pregnancy test negative, discussed with patient, Aptima swab pending, will treat per protocol, prophylactically treating for bacterial vaginosis due to history of reoccurring infection, MetroGel prescribed and discussed administration, advised abstinence until lab results, treatment is complete and symptoms have resolved, may follow-up with urgent care as needed if symptoms persist or worsen   Final Clinical Impressions(s) / UC Diagnoses   Final diagnoses:  Vaginal discharge     Discharge Instructions      Today you are being treated prophylactically for  Bacterial  vaginosis   Urinalysis and urine pregnancy test negative  Take Metronidazole 500 mg twice a day for 7 days, do not drink alcohol while using medication, this will make you feel sick   Bacterial vaginosis which results from an overgrowth of one on several organisms that are normally present in your vagina. Vaginosis is an inflammation of the vagina that can result in discharge, itching and pain.  Labs pending 2-3 days,  you will be contacted if positive for any sti and treatment will be sent to the pharmacy, you will have to return to the clinic if positive for gonorrhea to receive treatment   Please refrain from having sex until labs results, if positive please refrain from having sex until treatment complete and symptoms resolve   If positive for HIV, Syphilis, Chlamydia  gonorrhea or trichomoniasis please notify partner or partners so they may tested as well  Moving forward, it is recommended you use some form of protection against the transmission of sti infections  such as condoms or dental dams with each sexual encounter     In addition: Avoid baths, hot tubs and whirlpool spas.  Don't use scented or harsh soaps Avoid irritants. These include scented tampons and pads. Wipe from front to back after using the toilet. Don't douche. Your vagina doesn't require cleansing other than normal bathing.  Use a condom.  Wear cotton underwear, this fabric absorbs some moisture.        ED Prescriptions   None    PDMP not reviewed this encounter.   Hans Eden, NP 04/18/22 1158

## 2022-04-18 NOTE — ED Triage Notes (Signed)
Pt here for vaginal discharge and odor with urination x 3 days

## 2022-04-18 NOTE — Discharge Instructions (Addendum)
Today you are being treated prophylactically for  Bacterial vaginosis   Urinalysis and urine pregnancy test negative  Take Metronidazole evening before bed for 7 days  Bacterial vaginosis which results from an overgrowth of one on several organisms that are normally present in your vagina. Vaginosis is an inflammation of the vagina that can result in discharge, itching and pain.  Labs pending ,you will be contacted if positive for any sti and treatment will be sent to the pharmacy, you will have to return to the clinic if positive for gonorrhea to receive treatment   Please refrain from having sex until labs results, if positive please refrain from having sex until treatment complete and symptoms resolve   If positive for , Chlamydia  gonorrhea or trichomoniasis please notify partner or partners so they may tested as well  Moving forward, it is recommended you use some form of protection against the transmission of sti infections  such as condoms or dental dams with each sexual encounter     In addition: Avoid baths, hot tubs and whirlpool spas.  Don't use scented or harsh soaps Avoid irritants. These include scented tampons and pads. Wipe from front to back after using the toilet. Don't douche. Your vagina doesn't require cleansing other than normal bathing.  Use a condom.  Wear cotton underwear, this fabric absorbs some moisture.

## 2022-04-19 LAB — CERVICOVAGINAL ANCILLARY ONLY
Bacterial Vaginitis (gardnerella): POSITIVE — AB
Candida Glabrata: NEGATIVE
Candida Vaginitis: POSITIVE — AB
Chlamydia: NEGATIVE
Comment: NEGATIVE
Comment: NEGATIVE
Comment: NEGATIVE
Comment: NEGATIVE
Comment: NEGATIVE
Comment: NORMAL
Neisseria Gonorrhea: NEGATIVE
Trichomonas: NEGATIVE

## 2022-04-20 ENCOUNTER — Telehealth (HOSPITAL_COMMUNITY): Payer: Self-pay | Admitting: Emergency Medicine

## 2022-04-20 MED ORDER — FLUCONAZOLE 150 MG PO TABS: 150.0000 mg | ORAL_TABLET | Freq: Once | ORAL | 0 refills | Status: AC

## 2022-06-01 ENCOUNTER — Ambulatory Visit
Admission: EM | Admit: 2022-06-01 | Discharge: 2022-06-01 | Disposition: A | Discharge status: Home / Self Care | Payer: Medicaid Other | Attending: Internal Medicine | Admitting: Internal Medicine

## 2022-06-01 DIAGNOSIS — Z113 Encounter for screening for infections with a predominantly sexual mode of transmission: Secondary | ICD-10-CM | POA: Diagnosis present

## 2022-06-01 DIAGNOSIS — N3001 Acute cystitis with hematuria: Secondary | ICD-10-CM

## 2022-06-01 DIAGNOSIS — R300 Dysuria: Secondary | ICD-10-CM | POA: Insufficient documentation

## 2022-06-01 LAB — POCT URINALYSIS DIP (MANUAL ENTRY)
Bilirubin, UA: NEGATIVE
Glucose, UA: NEGATIVE mg/dL
Ketones, POC UA: NEGATIVE mg/dL
Nitrite, UA: POSITIVE — AB
Spec Grav, UA: >=1.03 — AB (ref 1.010–1.025)
Urobilinogen, UA: 1 E.U./dL
pH, UA: 6.5 (ref 5.0–8.0)

## 2022-06-01 MED ORDER — CEPHALEXIN 500 MG PO CAPS: 500.0000 mg | ORAL_CAPSULE | Freq: Two times a day (BID) | ORAL | 0 refills | Status: AC

## 2022-06-01 NOTE — Discharge Instructions (Signed)
It appears that you have a urinary tract infection so I am treating this with an antibiotic.  Urine culture and vaginal swab are pending.  We will call if they are abnormal.

## 2022-06-01 NOTE — ED Triage Notes (Signed)
Pt here for dysuria and vaginal discharge that started today. Pt states she had BV a couple of weeks ago. She did not complete her medication.

## 2022-06-01 NOTE — ED Provider Notes (Signed)
[facility]    CSN: [id number] Arrival date & time: 06/01/22  1900      History   Chief Complaint Chief Complaint  Patient presents with   Dysuria   Vaginal Discharge    HPI Adrienne Turner is a 37 y.o. female.   Patient presents with dysuria that started today.  Patient denies vaginal discharge, urinary frequency, hematuria, abdominal pain, back pain, fever.  Patient reports that she was treated for bacterial vaginosis and vaginal yeast in January.  She was seen in the urgent care on 04/18/2022 and treated with metronidazole gel and Diflucan.  She states that she only took 1 pill of the Diflucan but those symptoms resolved.  She states that she only had a vaginal odor at that time.  She did not have any urinary symptoms at the time so the symptoms are new.  Denies any exposure to STD but has had unprotected intercourse.  Last menstrual cycle completed a week ago per patient.   Dysuria Vaginal Discharge   Past Medical History:  Diagnosis Date   Medical history non-contributory     Patient Active Problem List   Diagnosis Date Noted   Nexplanon insertion 12/13/2018   Well woman exam 12/13/2018   Normal spontaneous vaginal delivery 10/26/2015   Active labor 10/24/2015   Supervision of normal pregnancy in third trimester 10/09/2015    Past Surgical History:  Procedure Laterality Date   NO PAST SURGERIES      OB History     Gravida  5   Para  3   Term  3   Preterm  0   AB  2   Living  3      SAB  0   IAB  2   Ectopic  0   Multiple  1   Live Births  1            Home Medications    Prior to Admission medications   Medication Sig Start Date End Date Taking? Authorizing Provider  cephALEXin (KEFLEX) 500 MG capsule Take 1 capsule (500 mg total) by mouth 2 (two) times daily for 7 days. 06/01/22 06/08/22 Yes Anorah Trias, Michele Rockers, FNP  acetaminophen (TYLENOL) 325 MG tablet Take 2 tablets (650 mg total) by mouth every 4 (four) hours as needed  (for pain scale < 4). Patient not taking: Reported on 11/29/2017 10/26/15   Mumaw, Lauralyn Primes, DO  ibuprofen (ADVIL,MOTRIN) 600 MG tablet Take 1 tablet (600 mg total) by mouth every 6 (six) hours. Patient not taking: Reported on 11/29/2017 10/26/15   Mumaw, Lauralyn Primes, DO  LO LOESTRIN FE 1 MG-10 MCG / 10 MCG tablet Take 1 tablet by mouth daily. 01/14/22   Darliss Cheney, MD  metroNIDAZOLE (METROGEL) 0.75 % vaginal gel Place 1 Applicatorful vaginally at bedtime. Apply one applicatorful to vagina at bedtime for 7 days. 04/18/22   Hans Eden, NP  Prenatal Vit-Fe Fumarate-FA (PRENATAL MULTIVITAMIN) TABS tablet Take 1 tablet by mouth 3 (three) times daily. Patient not taking: Reported on 01/14/2022    [provider]  sulfamethoxazole-trimethoprim (BACTRIM DS) 800-160 MG tablet Take 1 tablet by mouth 2 (two) times daily. Patient not taking: Reported on 04/18/2022 01/18/22   Darliss Cheney, MD    Family History History reviewed. No pertinent family history.  Social History Social History   Tobacco Use   Smoking status: Never   Smokeless tobacco: Never  Substance Use Topics   Alcohol use: No   Drug use: No  Allergies   Patient has no known allergies.   Review of Systems Review of Systems Per HPI  Physical Exam Triage Vital Signs ED Triage Vitals [06/01/22 1913]  Enc Vitals Group     BP 125/73     Pulse Rate 80     Resp 16     Temp 98.5 F (36.9 C)     Temp Source Oral     SpO2 100 %     Weight      Height      Head Circumference      Peak Flow      Pain Score      Pain Loc      Pain Edu?      Excl. in [place]?    No data found.  Updated Vital Signs BP 125/73 (BP Location: Left Arm)   Pulse 80   Temp 98.5 F (36.9 C) (Oral)   Resp 16   LMP 05/23/2022 (Approximate)   SpO2 100%   Visual Acuity Right Eye Distance:   Left Eye Distance:   Bilateral Distance:    Right Eye Near:   Left Eye Near:    Bilateral Near:     Physical  Exam Constitutional:      General: She is not in acute distress.    Appearance: Normal appearance. She is not toxic-appearing or diaphoretic.  HENT:     Head: Normocephalic and atraumatic.  Eyes:     Extraocular Movements: Extraocular movements intact.     Conjunctiva/sclera: Conjunctivae normal.  Pulmonary:     Effort: Pulmonary effort is normal.  Abdominal:     General: Bowel sounds are normal. There is no distension.     Palpations: Abdomen is soft.     Tenderness: There is no abdominal tenderness.  Genitourinary:    Comments: Deferred with shared decision making.  Self swab performed. Neurological:     General: No focal deficit present.     Mental Status: She is alert and oriented to person, place, and time. Mental status is at baseline.  Psychiatric:        Mood and Affect: Mood normal.        Behavior: Behavior normal.        Thought Content: Thought content normal.        Judgment: Judgment normal.      UC Treatments / Results  Labs (all labs ordered are listed, but only abnormal results are displayed) Labs Reviewed  POCT URINALYSIS DIP (MANUAL ENTRY) - Abnormal; Notable for the following components:      Result Value   Spec Grav, UA >=1.030 (*)    Blood, UA trace-intact (*)    Protein Ur, POC trace (*)    Nitrite, UA Positive (*)    Leukocytes, UA Trace (*)    All other components within normal limits  URINE CULTURE  CERVICOVAGINAL ANCILLARY ONLY    EKG   Radiology No results found.  Procedures Procedures (including critical care time)  Medications Ordered in UC Medications - No data to display  Initial Impression / Assessment and Plan / UC Course  I have reviewed the triage vital signs and the nursing notes.  Pertinent labs & imaging results that were available during my care of the patient were reviewed by me and considered in my medical decision making (see chart for details).     UA indicating urinary tract infection.  Will treat with  cephalexin.  Urine culture pending.  Cervicovaginal swab also pending to ensure  vaginitis is not contributing to symptoms and to ensure infection is cleared from previous visit.  Advised patient to follow-up if any symptoms persist or worsen.  Patient verbalized understanding and was agreeable with plan. Final Clinical Impressions(s) / UC Diagnoses   Final diagnoses:  Acute cystitis with hematuria  Dysuria  Screening examination for venereal disease     Discharge Instructions      It appears that you have a urinary tract infection so I am treating this with an antibiotic.  Urine culture and vaginal swab are pending.  We will call if they are abnormal.    ED Prescriptions     Medication Sig Dispense Auth. Provider   cephALEXin (KEFLEX) 500 MG capsule Take 1 capsule (500 mg total) by mouth 2 (two) times daily for 7 days. 14 capsule [place], Michele Rockers, [place]      PDMP not reviewed this encounter.   Teodora Medici, [place] 06/01/22 2028

## 2022-06-02 LAB — CERVICOVAGINAL ANCILLARY ONLY
Bacterial Vaginitis (gardnerella): POSITIVE — AB
Candida Glabrata: NEGATIVE
Candida Vaginitis: NEGATIVE
Chlamydia: NEGATIVE
Comment: NEGATIVE
Comment: NEGATIVE
Comment: NEGATIVE
Comment: NEGATIVE
Comment: NEGATIVE
Comment: NORMAL
Neisseria Gonorrhea: NEGATIVE
Trichomonas: NEGATIVE

## 2022-06-03 ENCOUNTER — Telehealth: Payer: Self-pay | Admitting: Emergency Medicine

## 2022-06-03 LAB — URINE CULTURE: Culture: >=100000 — AB

## 2022-06-03 MED ORDER — METRONIDAZOLE 500 MG PO TABS: 500.0000 mg | ORAL_TABLET | Freq: Two times a day (BID) | ORAL | 0 refills | Status: DC

## 2022-09-01 ENCOUNTER — Ambulatory Visit
Admission: EM | Admit: 2022-09-01 | Discharge: 2022-09-01 | Disposition: A | Discharge status: Home / Self Care | Payer: Medicaid Other | Attending: Internal Medicine | Admitting: Internal Medicine

## 2022-09-01 DIAGNOSIS — Z113 Encounter for screening for infections with a predominantly sexual mode of transmission: Secondary | ICD-10-CM | POA: Diagnosis present

## 2022-09-01 DIAGNOSIS — R300 Dysuria: Secondary | ICD-10-CM

## 2022-09-01 LAB — POCT URINALYSIS DIP (MANUAL ENTRY)
Bilirubin, UA: NEGATIVE
Glucose, UA: NEGATIVE mg/dL
Ketones, POC UA: NEGATIVE mg/dL
Leukocytes, UA: NEGATIVE
Nitrite, UA: NEGATIVE
Protein Ur, POC: NEGATIVE mg/dL
Spec Grav, UA: 1.025 (ref 1.010–1.025)
Urobilinogen, UA: 1 E.U./dL
pH, UA: 7 (ref 5.0–8.0)

## 2022-09-01 NOTE — ED Triage Notes (Signed)
Pt states burning with urination since yesterday. 

## 2022-09-01 NOTE — ED Provider Notes (Signed)
[facility]    CSN: [id number] Arrival date & time: 09/01/22  0807      History   Chief Complaint Chief Complaint  Patient presents with   Dysuria    HPI Adrienne Turner is a 37 y.o. female.   Patient presents with 2 episodes of dysuria that started yesterday.  Patient denies urinary frequency, vaginal discharge, hematuria, abdominal pain, back pain, fever, chills, nausea, vomiting.  Patient denies exposure to STD but has had unprotected sexual intercourse.  Last menstrual cycle was 08/13/2022.   Dysuria   Past Medical History:  Diagnosis Date   Medical history non-contributory     Patient Active Problem List   Diagnosis Date Noted   Nexplanon insertion 12/13/2018   Well woman exam 12/13/2018   Normal spontaneous vaginal delivery 10/26/2015   Active labor 10/24/2015   Supervision of normal pregnancy in third trimester 10/09/2015    Past Surgical History:  Procedure Laterality Date   NO PAST SURGERIES      OB History     Gravida  5   Para  3   Term  3   Preterm  0   AB  2   Living  3      SAB  0   IAB  2   Ectopic  0   Multiple  1   Live Births  1            Home Medications    Prior to Admission medications   Medication Sig Start Date End Date Taking? Authorizing Provider  acetaminophen (TYLENOL) 325 MG tablet Take 2 tablets (650 mg total) by mouth every 4 (four) hours as needed (for pain scale < 4). Patient not taking: Reported on 11/29/2017 10/26/15   Mumaw, Hiram Comber, DO  ibuprofen (ADVIL,MOTRIN) 600 MG tablet Take 1 tablet (600 mg total) by mouth every 6 (six) hours. Patient not taking: Reported on 11/29/2017 10/26/15   Mumaw, Hiram Comber, DO  LO LOESTRIN FE 1 MG-10 MCG / 10 MCG tablet Take 1 tablet by mouth daily. 01/14/22   Lorriane Shire, MD  metroNIDAZOLE (FLAGYL) 500 MG tablet Take 1 tablet (500 mg total) by mouth 2 (two) times daily. 06/03/22   Merrilee Jansky, MD  metroNIDAZOLE (METROGEL) 0.75 %  vaginal gel Place 1 Applicatorful vaginally at bedtime. Apply one applicatorful to vagina at bedtime for 7 days. 04/18/22   Valinda Hoar, NP  Prenatal Vit-Fe Fumarate-FA (PRENATAL MULTIVITAMIN) TABS tablet Take 1 tablet by mouth 3 (three) times daily. Patient not taking: Reported on 01/14/2022    [provider]  sulfamethoxazole-trimethoprim (BACTRIM DS) 800-160 MG tablet Take 1 tablet by mouth 2 (two) times daily. Patient not taking: Reported on 04/18/2022 01/18/22   Lorriane Shire, MD    Family History History reviewed. No pertinent family history.  Social History Social History   Tobacco Use   Smoking status: Never   Smokeless tobacco: Never  Substance Use Topics   Alcohol use: No   Drug use: No     Allergies   Patient has no known allergies.   Review of Systems Review of Systems Per HPI  Physical Exam Triage Vital Signs ED Triage Vitals  Enc Vitals Group     BP 09/01/22 0839 115/78     Pulse Rate 09/01/22 0839 79     Resp 09/01/22 0839 16     Temp 09/01/22 0839 98.5 F (36.9 C)     Temp Source 09/01/22 0839 Oral  SpO2 09/01/22 0839 98 %     Weight --      Height --      Head Circumference --      Peak Flow --      Pain Score 09/01/22 0840 0     Pain Loc --      Pain Edu? --      Excl. in [place]? --    No data found.  Updated Vital Signs BP 115/78 (BP Location: Left Arm)   Pulse 79   Temp 98.5 F (36.9 C) (Oral)   Resp 16   LMP 08/09/2022 (Exact Date)   SpO2 98%   Visual Acuity Right Eye Distance:   Left Eye Distance:   Bilateral Distance:    Right Eye Near:   Left Eye Near:    Bilateral Near:     Physical Exam Constitutional:      General: She is not in acute distress.    Appearance: Normal appearance. She is not toxic-appearing or diaphoretic.  HENT:     Head: Normocephalic and atraumatic.  Eyes:     Extraocular Movements: Extraocular movements intact.     Conjunctiva/sclera: Conjunctivae normal.  Pulmonary:      Effort: Pulmonary effort is normal.  Genitourinary:    Comments: Deferred with shared decision making.  Self swab performed. Neurological:     General: No focal deficit present.     Mental Status: She is alert and oriented to person, place, and time. Mental status is at baseline.  Psychiatric:        Mood and Affect: Mood normal.        Behavior: Behavior normal.        Thought Content: Thought content normal.        Judgment: Judgment normal.      UC Treatments / Results  Labs (all labs ordered are listed, but only abnormal results are displayed) Labs Reviewed  POCT URINALYSIS DIP (MANUAL ENTRY) - Abnormal; Notable for the following components:      Result Value   Clarity, UA hazy (*)    Blood, UA trace-intact (*)    All other components within normal limits  CERVICOVAGINAL ANCILLARY ONLY    EKG   Radiology No results found.  Procedures Procedures (including critical care time)  Medications Ordered in UC Medications - No data to display  Initial Impression / Assessment and Plan / UC Course  I have reviewed the triage vital signs and the nursing notes.  Pertinent labs & imaging results that were available during my care of the patient were reviewed by me and considered in my medical decision making (see chart for details).     UA not indicating urinary tract infection.  Therefore, I am suspicious that vaginitis could be cause of symptoms.  Will send cervicovaginal swab.  Awaiting results.  Advised patient to ensure adequate fluid hydration as well as this could be cause of symptoms.  Advised strict follow-up precautions if any symptoms persist or worsen.  Patient verbalized understanding and was agreeable with plan. Final Clinical Impressions(s) / UC Diagnoses   Final diagnoses:  Dysuria  Screening examination for venereal disease     Discharge Instructions      Your urine is not indicating urinary tract infection.  Will send vaginal swab off and call with  results.  Follow-up if any symptoms persist or worsen.    ED Prescriptions   None    PDMP not reviewed this encounter.   Gustavus Bryant, [place]  09/01/22 0944  

## 2022-09-01 NOTE — Discharge Instructions (Signed)
Your urine is not indicating urinary tract infection.  Will send vaginal swab off and call with results.  Follow-up if any symptoms persist or worsen.

## 2022-09-02 LAB — CERVICOVAGINAL ANCILLARY ONLY
Bacterial Vaginitis (gardnerella): NEGATIVE
Candida Glabrata: NEGATIVE
Candida Vaginitis: NEGATIVE
Chlamydia: NEGATIVE
Comment: NEGATIVE
Comment: NEGATIVE
Comment: NEGATIVE
Comment: NEGATIVE
Comment: NEGATIVE
Comment: NORMAL
Neisseria Gonorrhea: NEGATIVE
Trichomonas: NEGATIVE

## 2022-10-23 ENCOUNTER — Ambulatory Visit
Admission: EM | Admit: 2022-10-23 | Discharge: 2022-10-23 | Disposition: A | Discharge status: Home / Self Care | Payer: Medicaid Other | Attending: Internal Medicine | Admitting: Internal Medicine

## 2022-10-23 DIAGNOSIS — Z113 Encounter for screening for infections with a predominantly sexual mode of transmission: Secondary | ICD-10-CM | POA: Diagnosis not present

## 2022-10-23 DIAGNOSIS — Z119 Encounter for screening for infectious and parasitic diseases, unspecified: Secondary | ICD-10-CM

## 2022-10-23 DIAGNOSIS — Z7251 High risk heterosexual behavior: Secondary | ICD-10-CM

## 2022-10-23 DIAGNOSIS — R8290 Unspecified abnormal findings in urine: Secondary | ICD-10-CM | POA: Insufficient documentation

## 2022-10-23 DIAGNOSIS — Z3202 Encounter for pregnancy test, result negative: Secondary | ICD-10-CM | POA: Insufficient documentation

## 2022-10-23 DIAGNOSIS — N3001 Acute cystitis with hematuria: Secondary | ICD-10-CM | POA: Diagnosis not present

## 2022-10-23 LAB — POCT URINALYSIS DIP (MANUAL ENTRY)
Bilirubin, UA: NEGATIVE
Glucose, UA: NEGATIVE mg/dL
Ketones, POC UA: NEGATIVE mg/dL
Nitrite, UA: NEGATIVE
Protein Ur, POC: NEGATIVE mg/dL
Spec Grav, UA: 1.02 (ref 1.010–1.025)
Urobilinogen, UA: 1 E.U./dL
pH, UA: 6.5 (ref 5.0–8.0)

## 2022-10-23 LAB — POCT URINE PREGNANCY: Preg Test, Ur: NEGATIVE

## 2022-10-23 MED ORDER — CEPHALEXIN 500 MG PO CAPS: 500.0000 mg | ORAL_CAPSULE | Freq: Two times a day (BID) | ORAL | 0 refills | Status: AC

## 2022-10-23 NOTE — Discharge Instructions (Addendum)
I am treating you with antibiotic for suspicion of UTI.  Urine culture and vaginal swab are pending.  We will call if they are abnormal.  Follow-up with any persistent or worsening symptoms.  Urine pregnancy test was negative.

## 2022-10-23 NOTE — ED Triage Notes (Addendum)
Pt states foul smelling urine for the past 2 weeks. Pt also wants to be checked for STD.

## 2022-10-23 NOTE — ED Provider Notes (Signed)
[facility]    CSN: [id number] Arrival date & time: 10/23/22  0817      History   Chief Complaint Chief Complaint  Patient presents with   Dysuria    HPI Adrienne Turner is a 37 y.o. female.   Patient presents with malodorous urine that has been present for 2 weeks.  Denies dysuria, urinary frequency, hematuria, vaginal discharge, pelvic pain, abdominal pain, back pain, fever, chills, nausea, vomiting.  Last menstrual cycle was 10/01/2022.  Patient is requesting vaginal swab for STD testing as well as she has had unprotected intercourse.  Denies exposure to STD.   Dysuria   Past Medical History:  Diagnosis Date   Medical history non-contributory     Patient Active Problem List   Diagnosis Date Noted   Nexplanon insertion 12/13/2018   Well woman exam 12/13/2018   Normal spontaneous vaginal delivery 10/26/2015   Active labor 10/24/2015   Supervision of normal pregnancy in third trimester 10/09/2015    Past Surgical History:  Procedure Laterality Date   NO PAST SURGERIES      OB History     Gravida  5   Para  3   Term  3   Preterm  0   AB  2   Living  3      SAB  0   IAB  2   Ectopic  0   Multiple  1   Live Births  1            Home Medications    Prior to Admission medications   Medication Sig Start Date End Date Taking? Authorizing Provider  cephALEXin (KEFLEX) 500 MG capsule Take 1 capsule (500 mg total) by mouth 2 (two) times daily for 7 days. 10/23/22 10/30/22 Yes Jaxden Blyden, Acie Fredrickson, FNP  acetaminophen (TYLENOL) 325 MG tablet Take 2 tablets (650 mg total) by mouth every 4 (four) hours as needed (for pain scale < 4). Patient not taking: Reported on 11/29/2017 10/26/15   Mumaw, Hiram Comber, DO  ibuprofen (ADVIL,MOTRIN) 600 MG tablet Take 1 tablet (600 mg total) by mouth every 6 (six) hours. Patient not taking: Reported on 11/29/2017 10/26/15   Mumaw, Hiram Comber, DO  LO LOESTRIN FE 1 MG-10 MCG / 10 MCG tablet Take 1  tablet by mouth daily. 01/14/22   Lorriane Shire, MD  Prenatal Vit-Fe Fumarate-FA (PRENATAL MULTIVITAMIN) TABS tablet Take 1 tablet by mouth 3 (three) times daily. Patient not taking: Reported on 01/14/2022    [provider]    Family History History reviewed. No pertinent family history.  Social History Social History   Tobacco Use   Smoking status: Never   Smokeless tobacco: Never  Substance Use Topics   Alcohol use: No   Drug use: No     Allergies   Patient has no known allergies.   Review of Systems Review of Systems Per HPI  Physical Exam Triage Vital Signs ED Triage Vitals  Encounter Vitals Group     BP 10/23/22 0828 135/81     Systolic BP Percentile --      Diastolic BP Percentile --      Pulse Rate 10/23/22 0828 66     Resp 10/23/22 0828 16     Temp 10/23/22 0828 98.2 F (36.8 C)     Temp Source 10/23/22 0828 Oral     SpO2 10/23/22 0828 98 %     Weight --      Height --  Head Circumference --      Peak Flow --      Pain Score 10/23/22 0830 0     Pain Loc --      Pain Education --      Exclude from Growth Chart --    No data found.  Updated Vital Signs BP 135/81 (BP Location: Left Arm)   Pulse 66   Temp 98.2 F (36.8 C) (Oral)   Resp 16   LMP 10/01/2022 (Exact Date)   SpO2 98%   Visual Acuity Right Eye Distance:   Left Eye Distance:   Bilateral Distance:    Right Eye Near:   Left Eye Near:    Bilateral Near:     Physical Exam Constitutional:      General: She is not in acute distress.    Appearance: Normal appearance. She is not toxic-appearing or diaphoretic.  HENT:     Head: Normocephalic and atraumatic.  Eyes:     Extraocular Movements: Extraocular movements intact.     Conjunctiva/sclera: Conjunctivae normal.  Pulmonary:     Effort: Pulmonary effort is normal.  Genitourinary:    Comments: Deferred with shared decision making. Self swab performed. Neurological:     General: No focal deficit present.      Mental Status: She is alert and oriented to person, place, and time. Mental status is at baseline.  Psychiatric:        Mood and Affect: Mood normal.        Behavior: Behavior normal.        Thought Content: Thought content normal.        Judgment: Judgment normal.      UC Treatments / Results  Labs (all labs ordered are listed, but only abnormal results are displayed) Labs Reviewed  POCT URINALYSIS DIP (MANUAL ENTRY) - Abnormal; Notable for the following components:      Result Value   Blood, UA trace-intact (*)    Leukocytes, UA Trace (*)    All other components within normal limits  URINE CULTURE  POCT URINE PREGNANCY  CERVICOVAGINAL ANCILLARY ONLY    EKG   Radiology No results found.  Procedures Procedures (including critical care time)  Medications Ordered in UC Medications - No data to display  Initial Impression / Assessment and Plan / UC Course  I have reviewed the triage vital signs and the nursing notes.  Pertinent labs & imaging results that were available during my care of the patient were reviewed by me and considered in my medical decision making (see chart for details).     UA has trace amount of leukocytes and with associated malodorous urine, will opt totreat with cephalexin for UTI while awaiting urine culture.  If urine culture is negative, patient may discontinue antibiotic.  Cervicovaginal swab pending to rule out vaginitis as cause of malodorous urine.  Pregnancy test negative.  Advised strict follow-up precautions if any symptoms persist or worsen.  Patient verbalized understanding and was agreeable with plan. Final Clinical Impressions(s) / UC Diagnoses   Final diagnoses:  Malodorous urine  Screening examination for venereal disease  Acute cystitis with hematuria  Urine pregnancy test negative     Discharge Instructions      I am treating you with antibiotic for suspicion of UTI.  Urine culture and vaginal swab are pending.  We will  call if they are abnormal.  Follow-up with any persistent or worsening symptoms.  Urine pregnancy test was negative.    ED Prescriptions  Medication Sig Dispense Auth. Provider   cephALEXin (KEFLEX) 500 MG capsule Take 1 capsule (500 mg total) by mouth 2 (two) times daily for 7 days. 14 capsule [place], Acie Fredrickson, [place]      PDMP not reviewed this encounter.   Gustavus Bryant, [place] 10/23/22 [phone]

## 2022-10-25 LAB — CERVICOVAGINAL ANCILLARY ONLY
Bacterial Vaginitis (gardnerella): NEGATIVE
Candida Glabrata: NEGATIVE
Candida Vaginitis: NEGATIVE
Chlamydia: NEGATIVE
Comment: NEGATIVE
Comment: NEGATIVE
Comment: NEGATIVE
Comment: NEGATIVE
Comment: NEGATIVE
Comment: NORMAL
Neisseria Gonorrhea: NEGATIVE
Trichomonas: NEGATIVE

## 2022-10-25 LAB — URINE CULTURE: Culture: 30000 — AB

## 2023-03-25 ENCOUNTER — Ambulatory Visit
Admission: EM | Admit: 2023-03-25 | Discharge: 2023-03-25 | Disposition: A | Discharge status: Home / Self Care | Payer: Medicaid Other | Attending: Physician Assistant | Admitting: Physician Assistant

## 2023-03-25 ENCOUNTER — Ambulatory Visit (INDEPENDENT_AMBULATORY_CARE_PROVIDER_SITE_OTHER): Payer: Medicaid Other

## 2023-03-25 DIAGNOSIS — M542 Cervicalgia: Secondary | ICD-10-CM | POA: Insufficient documentation

## 2023-03-25 DIAGNOSIS — W010XXA Fall on same level from slipping, tripping and stumbling without subsequent striking against object, initial encounter: Secondary | ICD-10-CM | POA: Diagnosis not present

## 2023-03-25 DIAGNOSIS — S46912A Strain of unspecified muscle, fascia and tendon at shoulder and upper arm level, left arm, initial encounter: Secondary | ICD-10-CM | POA: Diagnosis not present

## 2023-03-25 DIAGNOSIS — M25512 Pain in left shoulder: Secondary | ICD-10-CM | POA: Insufficient documentation

## 2023-03-25 DIAGNOSIS — N898 Other specified noninflammatory disorders of vagina: Secondary | ICD-10-CM | POA: Diagnosis present

## 2023-03-25 DIAGNOSIS — W19XXXA Unspecified fall, initial encounter: Secondary | ICD-10-CM | POA: Diagnosis not present

## 2023-03-25 DIAGNOSIS — N3000 Acute cystitis without hematuria: Secondary | ICD-10-CM | POA: Insufficient documentation

## 2023-03-25 DIAGNOSIS — Z113 Encounter for screening for infections with a predominantly sexual mode of transmission: Secondary | ICD-10-CM | POA: Insufficient documentation

## 2023-03-25 DIAGNOSIS — R8290 Unspecified abnormal findings in urine: Secondary | ICD-10-CM | POA: Insufficient documentation

## 2023-03-25 DIAGNOSIS — R82998 Other abnormal findings in urine: Secondary | ICD-10-CM | POA: Insufficient documentation

## 2023-03-25 DIAGNOSIS — Z87440 Personal history of urinary (tract) infections: Secondary | ICD-10-CM | POA: Insufficient documentation

## 2023-03-25 LAB — POCT URINALYSIS DIP (MANUAL ENTRY)
Bilirubin, UA: NEGATIVE
Glucose, UA: NEGATIVE mg/dL
Ketones, POC UA: NEGATIVE mg/dL
Leukocytes, UA: NEGATIVE
Nitrite, UA: POSITIVE — AB
Protein Ur, POC: NEGATIVE mg/dL
Spec Grav, UA: 1.02 (ref 1.010–1.025)
Urobilinogen, UA: 0.2 U/dL
pH, UA: 8 (ref 5.0–8.0)

## 2023-03-25 MED ORDER — BACLOFEN 10 MG PO TABS: 10.0000 mg | ORAL_TABLET | Freq: Two times a day (BID) | ORAL | 0 refills | Status: DC | PRN

## 2023-03-25 MED ORDER — NAPROXEN 375 MG PO TABS: 375.0000 mg | ORAL_TABLET | Freq: Two times a day (BID) | ORAL | 0 refills | Status: DC

## 2023-03-25 MED ORDER — CEPHALEXIN 500 MG PO CAPS: 500.0000 mg | ORAL_CAPSULE | Freq: Two times a day (BID) | ORAL | 0 refills | Status: DC

## 2023-03-25 NOTE — ED Triage Notes (Signed)
Patient reports she fell across the treadmill on Wednesday. Left arm and neck pain.   Patient states she is still having vaginal odor, did not pick up her last rx for her last treatment.

## 2023-03-25 NOTE — ED Provider Notes (Signed)
[facility]    CSN: [id number] Arrival date & time: 03/25/23  4098      History   Chief Complaint No chief complaint on file.   HPI Adrienne Turner is a 37 y.o. female.   Patient presents today with several concerns.  Her primary concern today is left neck/shoulder pain following an injury.  Reports that she was playing around on a treadmill when she slipped and fell landing on her back.  She did not hit her head and denies any loss of consciousness, headache, dizziness, nausea, vomiting.  She did not have any pain initially but over the past few days has developed neck and left shoulder pain.  She denies any numbness or paresthesias in her hand.  She is right-handed.  Reports discomfort is rated 6/7 on a 0-10 pain scale, described as soreness, worse with movement, no alleviating factors identified.  She has not tried any over-the-counter medication for symptom management.  In addition, she reports that in July she was seen and told that she has a UTI.  She was prescribed antibiotics but never got these medications.  She continues to have some malodorous urine/vaginal odor.  She does have mild vaginal discharge and so is interested in STI testing that she has not been sexually active recently.  She denies any recent antibiotics.  Denies SGLT2 inhibitor use.  She denies history of nephrolithiasis or recent urogenital procedure.    Past Medical History:  Diagnosis Date   Medical history non-contributory     Patient Active Problem List   Diagnosis Date Noted   Nexplanon insertion 12/13/2018   Well woman exam 12/13/2018   Normal spontaneous vaginal delivery 10/26/2015   Active labor 10/24/2015   Supervision of normal pregnancy in third trimester 10/09/2015    Past Surgical History:  Procedure Laterality Date   NO PAST SURGERIES      OB History     Gravida  5   Para  3   Term  3   Preterm  0   AB  2   Living  3      SAB  0   IAB  2   Ectopic  0    Multiple  1   Live Births  1            Home Medications    Prior to Admission medications   Medication Sig Start Date End Date Taking? Authorizing Provider  baclofen (LIORESAL) 10 MG tablet Take 1 tablet (10 mg total) by mouth 2 (two) times daily as needed for muscle spasms. 03/25/23  Yes Jesiel Garate K, PA-C  cephALEXin (KEFLEX) 500 MG capsule Take 1 capsule (500 mg total) by mouth 2 (two) times daily. 03/25/23  Yes Heinrich Fertig K, PA-C  naproxen (NAPROSYN) 375 MG tablet Take 1 tablet (375 mg total) by mouth 2 (two) times daily. 03/25/23  Yes Sumer Moorehouse, Noberto Retort, PA-C  acetaminophen (TYLENOL) 325 MG tablet Take 2 tablets (650 mg total) by mouth every 4 (four) hours as needed (for pain scale < 4). Patient not taking: Reported on 11/29/2017 10/26/15   Mumaw, Hiram Comber, DO  LO LOESTRIN FE 1 MG-10 MCG / 10 MCG tablet Take 1 tablet by mouth daily. 01/14/22   Lorriane Shire, MD  Prenatal Vit-Fe Fumarate-FA (PRENATAL MULTIVITAMIN) TABS tablet Take 1 tablet by mouth 3 (three) times daily. Patient not taking: Reported on 01/14/2022    [provider]    Family History History reviewed. No pertinent family history.  Social History Social History   Tobacco Use   Smoking status: Never   Smokeless tobacco: Never  Substance Use Topics   Alcohol use: Yes   Drug use: No     Allergies   Patient has no known allergies.   Review of Systems Review of Systems  Constitutional:  Positive for activity change. Negative for appetite change, fatigue and fever.  Gastrointestinal:  Negative for abdominal pain, diarrhea, nausea and vomiting.  Genitourinary:  Positive for frequency. Negative for dysuria, urgency, vaginal bleeding, vaginal discharge and vaginal pain.  Musculoskeletal:  Positive for arthralgias and neck pain. Negative for myalgias.     Physical Exam Triage Vital Signs ED Triage Vitals  Encounter Vitals Group     BP 03/25/23 1041 117/87     Systolic BP  Percentile --      Diastolic BP Percentile --      Pulse Rate 03/25/23 1041 90     Resp 03/25/23 1041 16     Temp 03/25/23 1041 98.7 F (37.1 C)     Temp Source 03/25/23 1041 Oral     SpO2 03/25/23 1041 98 %     Weight 03/25/23 1040 195 lb (88.5 kg)     Height 03/25/23 1040 5\' 5"  (1.651 m)     Head Circumference --      Peak Flow --      Pain Score 03/25/23 1040 5     Pain Loc --      Pain Education --      Exclude from Growth Chart --    No data found.  Updated Vital Signs BP 117/87 (BP Location: Left Arm)   Pulse 90   Temp 98.7 F (37.1 C) (Oral)   Resp 16   Ht 5\' 5"  (1.651 m)   Wt 195 lb (88.5 kg)   LMP 03/11/2023   SpO2 98%   Breastfeeding No   BMI 32.45 kg/m   Visual Acuity Right Eye Distance:   Left Eye Distance:   Bilateral Distance:    Right Eye Near:   Left Eye Near:    Bilateral Near:     Physical Exam Vitals reviewed.  Constitutional:      General: She is awake. She is not in acute distress.    Appearance: Normal appearance. She is well-developed. She is not ill-appearing.     Comments: Very pleasant female appears stated age in no acute distress sitting comfortably in exam room  HENT:     Head: Normocephalic and atraumatic.  Cardiovascular:     Rate and Rhythm: Normal rate and regular rhythm.     Heart sounds: Normal heart sounds, S1 normal and S2 normal. No murmur heard. Pulmonary:     Effort: Pulmonary effort is normal.     Breath sounds: Normal breath sounds. No wheezing, rhonchi or rales.     Comments: Clear to auscultation bilaterally Abdominal:     Palpations: Abdomen is soft.     Tenderness: There is no abdominal tenderness. There is no right CVA tenderness or left CVA tenderness.  Musculoskeletal:     Cervical back: Tenderness present. No bony tenderness.     Thoracic back: No tenderness or bony tenderness.     Lumbar back: No tenderness or bony tenderness.     Comments: Back: Tender to palpation over left trapezius and along  cervical paraspinal muscles.  No pain percussion of vertebrae.  No deformity or step-off noted.  Normal active range of motion of neck.  Left shoulder: Tenderness  along spine of scapula and at glenohumeral head.  Normal active range of motion.  Negative drop arm and empty can.  Hand neurovascularly intact.  Psychiatric:        Behavior: Behavior is cooperative.      UC Treatments / Results  Labs (all labs ordered are listed, but only abnormal results are displayed) Labs Reviewed  POCT URINALYSIS DIP (MANUAL ENTRY) - Abnormal; Notable for the following components:      Result Value   Clarity, UA hazy (*)    Blood, UA trace-intact (*)    Nitrite, UA Positive (*)    All other components within normal limits  URINE CULTURE  CERVICOVAGINAL ANCILLARY ONLY    EKG   Radiology DG Shoulder Left Result Date: 03/25/2023 CLINICAL DATA:  Left shoulder pain after fall. EXAM: LEFT SHOULDER - 2+ VIEW COMPARISON:  None Available. FINDINGS: There is no evidence of fracture or dislocation. There is no evidence of arthropathy or other focal bone abnormality. Soft tissues are unremarkable. IMPRESSION: Negative. Electronically Signed   By: Obie Dredge M.D.   On: 03/25/2023 12:30    Procedures Procedures (including critical care time)  Medications Ordered in UC Medications - No data to display  Initial Impression / Assessment and Plan / UC Course  I have reviewed the triage vital signs and the nursing notes.  Pertinent labs & imaging results that were available during my care of the patient were reviewed by me and considered in my medical decision making (see chart for details).     Patient is well-appearing, afebrile, nontoxic, nontachycardic.  No indication for head or neck CT based on [place] rules.  X-ray of left shoulder was obtained given tenderness following recent trauma that showed no acute osseous abnormality.  Suspect sprain as etiology of symptoms.  Will start Naprosyn twice  daily and she was instructed not to take NSAIDs with this medication due to risk of GI bleeding.  She was also given baclofen with instruction not to drive or drink alcohol while taking this medication.  If her symptoms are not improving quickly with conservative treatment measures she is to follow-up with sports medicine and was given contact information for local provider with instruction to call to schedule an appointment.  We discussed that if anything worsens or changes she is to return for reevaluation.  Work excuse note was provided.  Patient continues to have malodorous urine/vaginal discharge.  Urine was obtained that positive nitrites.  Given history of UTI that was never sufficiently treated we will start cephalexin twice daily for 5 days.  STI swab was collected and is pending.  We will also send her urine off for culture and contact her if we need to stop or change her antibiotics based on culture results.  Discussed that cephalexin can decrease the effectiveness of her birth control so she should use backup birth control while on this medication until her next menstrual cycle.  We discussed that if anything worsens and she has pelvic pain, abdominal pain, fever, nausea, vomiting she needs to be seen immediately.  Final Clinical Impressions(s) / UC Diagnoses   Final diagnoses:  Acute pain of left shoulder  Acute cystitis without hematuria  Vaginal odor  Screening examination for STI  Malodorous urine  Fall, initial encounter  Strain of left shoulder, initial encounter     Discharge Instructions      Your x-ray was normal with no evidence of fracture/dislocation.  Start Naprosyn twice daily.  Do not take NSAIDs  with this medication including aspirin, ibuprofen/Advil, naproxen/Aleve.  You can use acetaminophen/Tylenol as needed.  Take baclofen up to twice a day.  This will make you sleepy so do not drive or drink alcohol while taking it.  If your symptoms are not improving please  follow-up with sports medicine; call to schedule an appointment.  Anything worsens return for reevaluation.  Your urine had evidence of bacteria.  Start cephalexin twice daily for 5 days.  This can decrease the effectiveness of your birth control so use backup birth control while on this medication and until your next menstrual cycle.  We will contact you if we need to change or stop your antibiotics based on your culture results.  If anything worsens and you have abdominal pain, fever, nausea, vomiting you need to be seen immediately.     ED Prescriptions     Medication Sig Dispense Auth. Provider   cephALEXin (KEFLEX) 500 MG capsule Take 1 capsule (500 mg total) by mouth 2 (two) times daily. 10 capsule Tenika Keeran K, PA-C   naproxen (NAPROSYN) 375 MG tablet Take 1 tablet (375 mg total) by mouth 2 (two) times daily. 20 tablet Allizon Woznick K, PA-C   baclofen (LIORESAL) 10 MG tablet Take 1 tablet (10 mg total) by mouth 2 (two) times daily as needed for muscle spasms. 20 each Trenae Brunke, Noberto Retort, PA-C      PDMP not reviewed this encounter.   Jeani Hawking, PA-C 03/25/23 1238

## 2023-03-25 NOTE — Discharge Instructions (Signed)
Your x-ray was normal with no evidence of fracture/dislocation.  Start Naprosyn twice daily.  Do not take NSAIDs with this medication including aspirin, ibuprofen/Advil, naproxen/Aleve.  You can use acetaminophen/Tylenol as needed.  Take baclofen up to twice a day.  This will make you sleepy so do not drive or drink alcohol while taking it.  If your symptoms are not improving please follow-up with sports medicine; call to schedule an appointment.  Anything worsens return for reevaluation.  Your urine had evidence of bacteria.  Start cephalexin twice daily for 5 days.  This can decrease the effectiveness of your birth control so use backup birth control while on this medication and until your next menstrual cycle.  We will contact you if we need to change or stop your antibiotics based on your culture results.  If anything worsens and you have abdominal pain, fever, nausea, vomiting you need to be seen immediately.

## 2023-03-27 LAB — URINE CULTURE: Culture: >=100000 — AB

## 2023-03-28 LAB — CERVICOVAGINAL ANCILLARY ONLY
Bacterial Vaginitis (gardnerella): NEGATIVE
Candida Glabrata: NEGATIVE
Candida Vaginitis: NEGATIVE
Chlamydia: NEGATIVE
Comment: NEGATIVE
Comment: NEGATIVE
Comment: NEGATIVE
Comment: NEGATIVE
Comment: NEGATIVE
Comment: NORMAL
Neisseria Gonorrhea: NEGATIVE
Trichomonas: NEGATIVE

## 2024-04-16 ENCOUNTER — Encounter: Payer: Self-pay | Admitting: *Deleted

## 2024-04-16 ENCOUNTER — Ambulatory Visit: Admission: EM | Admit: 2024-04-16 | Discharge: 2024-04-16 | Disposition: A | Discharge status: Home / Self Care

## 2024-04-16 ENCOUNTER — Other Ambulatory Visit: Payer: Self-pay

## 2024-04-16 DIAGNOSIS — J111 Influenza due to unidentified influenza virus with other respiratory manifestations: Secondary | ICD-10-CM | POA: Diagnosis not present

## 2024-04-16 MED ORDER — OSELTAMIVIR PHOSPHATE 75 MG PO CAPS: 75.0000 mg | ORAL_CAPSULE | Freq: Two times a day (BID) | ORAL | 0 refills | Status: AC

## 2024-04-16 MED ORDER — IBUPROFEN 800 MG PO TABS: 800.0000 mg | ORAL_TABLET | Freq: Three times a day (TID) | ORAL | 0 refills | Status: AC

## 2024-04-16 NOTE — Discharge Instructions (Addendum)
 You have been clinically diagnosed with flu today, will send Tamiflu  to be started today. Continue use of OTC cold meds with Tamiflu .

## 2024-04-16 NOTE — ED Triage Notes (Signed)
 Pt reports fever since yesterday- 102 at home. She took ibuprofen  pta. Also congestion, headache,body aches. She works at a daycare

## 2024-04-16 NOTE — ED Provider Notes (Signed)
 " [facility]    CSN: [id number] Arrival date & time: 04/16/24  1020      History   Chief Complaint Chief Complaint  Patient presents with   Fever    HPI Adrienne Turner is a 39 y.o. female.   Pt presents today due to fever (highest temp 102), body aches, nasal congestion, and headache that started yesterday. Pt denies cough or known sick contacts. 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 Pt also reports reduced appetite.   The history is provided by the patient.  Fever   Past Medical History:  Diagnosis Date   Medical history non-contributory     Patient Active Problem List   Diagnosis Date Noted   Nexplanon  insertion 12/13/2018   Well woman exam 12/13/2018   Normal spontaneous vaginal delivery 10/26/2015   Active labor 10/24/2015   Supervision of normal pregnancy in third trimester 10/09/2015    Past Surgical History:  Procedure Laterality Date   NO PAST SURGERIES      OB History     Gravida  5   Para  3   Term  3   Preterm  0   AB  2   Living  3      SAB  0   IAB  2   Ectopic  0   Multiple  1   Live Births  1            Home Medications    Prior to Admission medications  Medication Sig Start Date End Date Taking? Authorizing Provider  ibuprofen  (ADVIL ) 800 MG tablet Take 1 tablet (800 mg total) by mouth 3 (three) times daily. 04/16/24  Yes Andra Corean JAYSON, PA-C  oseltamivir  (TAMIFLU ) 75 MG capsule Take 1 capsule (75 mg total) by mouth every 12 (twelve) hours. 04/16/24  Yes Andra Corean C, PA-C  LO LOESTRIN FE  1 MG-10 MCG / 10 MCG tablet Take 1 tablet by mouth daily. Patient not taking: Reported on 04/16/2024 01/14/22   Jeralyn Crutch, MD    Family History No family history on file.  Social History Social History[1]   Allergies   Patient has no known allergies.   Review of Systems Review of Systems  Constitutional:  Positive for fever.     Physical Exam Triage Vital Signs ED Triage  Vitals  Encounter Vitals Group     BP 04/16/24 1103 116/79     Girls Systolic BP Percentile --      Girls Diastolic BP Percentile --      Boys Systolic BP Percentile --      Boys Diastolic BP Percentile --      Pulse Rate 04/16/24 1103 85     Resp 04/16/24 1103 18     Temp 04/16/24 1103 99.8 F (37.7 C)     Temp Source 04/16/24 1103 Oral     SpO2 04/16/24 1103 98 %     Weight --      Height --      Head Circumference --      Peak Flow --      Pain Score 04/16/24 1101 2     Pain Loc --      Pain Education --      Exclude from Growth Chart --    No data found.  Updated Vital Signs BP 116/79 (BP Location: Left Arm)   Pulse 85   Temp 99.8 F (37.7 C) (Oral)   Resp 18   LMP 04/02/2024  SpO2 98%   Visual Acuity Right Eye Distance:   Left Eye Distance:   Bilateral Distance:    Right Eye Near:   Left Eye Near:    Bilateral Near:     Physical Exam Vitals and nursing note reviewed.  Constitutional:      General: She is not in acute distress.    Appearance: Normal appearance. She is not ill-appearing, toxic-appearing or diaphoretic.  HENT:     Nose: Congestion (mildly enlarged turbinates) present. No rhinorrhea.     Mouth/Throat:     Mouth: Mucous membranes are moist.     Pharynx: Oropharynx is clear. No oropharyngeal exudate or posterior oropharyngeal erythema.  Eyes:     General: No scleral icterus. Cardiovascular:     Rate and Rhythm: Normal rate and regular rhythm.     Heart sounds: Normal heart sounds.  Pulmonary:     Effort: Pulmonary effort is normal. No respiratory distress.     Breath sounds: Normal breath sounds. No wheezing or rhonchi.  Skin:    General: Skin is warm.  Neurological:     Mental Status: She is alert and oriented to person, place, and time.  Psychiatric:        Mood and Affect: Mood normal.        Behavior: Behavior normal.      UC Treatments / Results  Labs (all labs ordered are listed, but only abnormal results are  displayed) Labs Reviewed - No data to display  EKG   Radiology No results found.  Procedures Procedures (including critical care time)  Medications Ordered in UC Medications - No data to display  Initial Impression / Assessment and Plan / UC Course  I have reviewed the triage vital signs and the nursing notes.  Pertinent labs & imaging results that were available during my care of the patient were reviewed by me and considered in my medical decision making (see chart for details).      Final Clinical Impressions(s) / UC Diagnoses   Final diagnoses:  Influenza     Discharge Instructions      You have been clinically diagnosed with flu today, will send Tamiflu  to be started today. Continue use of OTC cold meds with Tamiflu .     ED Prescriptions     Medication Sig Dispense Auth. Provider   oseltamivir  (TAMIFLU ) 75 MG capsule Take 1 capsule (75 mg total) by mouth every 12 (twelve) hours. 10 capsule Andra Corean BROCKS, PA-C   ibuprofen  (ADVIL ) 800 MG tablet Take 1 tablet (800 mg total) by mouth 3 (three) times daily. 21 tablet Andra Corean BROCKS, PA-C      PDMP not reviewed this encounter.    [1]  Social History Tobacco Use   Smoking status: Never   Smokeless tobacco: Never  Vaping Use   Vaping status: Never Used  Substance Use Topics   Alcohol use: Yes   Drug use: No     Andra Corean BROCKS, PA-C 04/16/24 1115  "
